# Patient Record
Sex: Female | Born: 1965 | Race: White | Hispanic: No | Marital: Married | Smoking: Former smoker
Health system: Southern US, Community
[De-identification: ages and names within clinical notes are randomized; demographics above are authoritative.]

## PROBLEM LIST (undated history)

## (undated) DIAGNOSIS — Q8501 Neurofibromatosis, type 1: Principal | ICD-10-CM

## (undated) DIAGNOSIS — N63 Unspecified lump in unspecified breast: Secondary | ICD-10-CM

## (undated) DIAGNOSIS — K219 Gastro-esophageal reflux disease without esophagitis: Secondary | ICD-10-CM

## (undated) DIAGNOSIS — F329 Major depressive disorder, single episode, unspecified: Secondary | ICD-10-CM

## (undated) DIAGNOSIS — E039 Hypothyroidism, unspecified: Secondary | ICD-10-CM

## (undated) DIAGNOSIS — R42 Dizziness and giddiness: Secondary | ICD-10-CM

## (undated) DIAGNOSIS — F419 Anxiety disorder, unspecified: Secondary | ICD-10-CM

## (undated) DIAGNOSIS — R519 Headache, unspecified: Secondary | ICD-10-CM

## (undated) DIAGNOSIS — F32A Depression, unspecified: Secondary | ICD-10-CM

## (undated) DIAGNOSIS — G43019 Migraine without aura, intractable, without status migrainosus: Secondary | ICD-10-CM

## (undated) DIAGNOSIS — R1310 Dysphagia, unspecified: Secondary | ICD-10-CM

## (undated) DIAGNOSIS — Q8500 Neurofibromatosis, unspecified: Secondary | ICD-10-CM

## (undated) DIAGNOSIS — R51 Headache: Secondary | ICD-10-CM

## (undated) DIAGNOSIS — M549 Dorsalgia, unspecified: Secondary | ICD-10-CM

## (undated) HISTORY — DX: Depression, unspecified: F32.A

## (undated) HISTORY — DX: Neurofibromatosis, type 1: Q85.01

## (undated) HISTORY — DX: Anxiety disorder, unspecified: F41.9

## (undated) HISTORY — PX: TUBAL LIGATION: SHX77

## (undated) HISTORY — DX: Headache: R51

## (undated) HISTORY — DX: Gastro-esophageal reflux disease without esophagitis: K21.9

## (undated) HISTORY — PX: BLADDER SURGERY: SHX569

## (undated) HISTORY — DX: Dorsalgia, unspecified: M54.9

## (undated) HISTORY — DX: Major depressive disorder, single episode, unspecified: F32.9

## (undated) HISTORY — DX: Unspecified lump in unspecified breast: N63.0

## (undated) HISTORY — DX: Migraine without aura, intractable, without status migrainosus: G43.019

## (undated) HISTORY — DX: Dysphagia, unspecified: R13.10

## (undated) HISTORY — DX: Dizziness and giddiness: R42

## (undated) HISTORY — PX: BREAST LUMPECTOMY: SHX2

## (undated) HISTORY — PX: CHOLECYSTECTOMY: SHX55

## (undated) HISTORY — DX: Headache, unspecified: R51.9

## (undated) HISTORY — DX: Hypothyroidism, unspecified: E03.9

## (undated) HISTORY — DX: Neurofibromatosis, unspecified: Q85.00

---

## 2007-03-21 ENCOUNTER — Ambulatory Visit: Payer: Self-pay | Admitting: Cardiology

## 2007-10-09 ENCOUNTER — Ambulatory Visit: Payer: Self-pay | Admitting: Cardiology

## 2007-11-01 ENCOUNTER — Ambulatory Visit: Payer: Self-pay | Admitting: Cardiology

## 2008-02-25 ENCOUNTER — Ambulatory Visit: Payer: Self-pay | Admitting: Cardiology

## 2011-04-26 NOTE — Assessment & Plan Note (Signed)
[facility]                          [facility] OFFICE NOTE   NAME:[name], [name] PHILLEY                        MRN:          [id number]  DATE:11/01/2007                            DOB:          [birth year]    DATE OF VISIT:  November 01, 2007.   PRIMARY CARDIOLOGIST:  Learta Codding, MD.   REASON FOR VISIT:  Post hospital followup.   HISTORY OF PRESENT ILLNESS:  [name] [name] is a pleasant, 45 year old woman  seen in consultation by Dr. Andee Lineman back on the 28th of October.  She was  admitted at that time with atypical chest pain in the setting of a  family history of cardiovascular disease and hypothyroidism.  She was  referred for a CT scan of her chest, which based on the consultation  note revealed a left apical nodule with recommended followup in three to  six months.  No pulmonary embolus was identified.  She also underwent an  echocardiogram, which was reassuring revealing an ejection fraction of  60 to 65% with trace mitral regurgitation.  Subsequent stress testing  was also performed, and the patient underwent an exercise Cardiolite  demonstrating no diagnostic electrocardiographic changes and no  significant perfusion defects.   She comes into the office today describing recurrent symptoms of  epigastric sharp pain.  There is no particular pattern to this.  She  describes one episode when she was driving in the car from here to  [facility] and had symptoms that lasted approximately 20 minutes and  seemed to be worse when she would press in her epigastric area.  She is  already status post cholecystectomy.  She has had no breathlessness or  clearly reproducible chest pain with exertion.  She does report having  reflux at times and uses an over-the-counter antacid p.r.n.   ALLERGIES:  No known drug allergies.   PRESENT MEDICATIONS:  Levothyroxine 88 mcg daily.   REVIEW OF SYSTEMS:  As described in the history of present illness.  Otherwise  negative.   PHYSICAL EXAMINATION:  VITAL SIGNS:  Blood pressure is 112/72, heart  rate is 66, weight is 127.4 pounds.  GENERAL:  The patient is comfortable and in no acute distress.  HEENT:  Conjunctivae and lids normal, oropharynx clear.  NECK:  Supple, no elevated jugulovenous pressure, no loud bruits, no  thyromegaly is noted.  LUNGS:  Clear without labored breathing.  CARDIAC:  Regular rate and rhythm, no S3 gallop, no loud murmur or  pericardial rub.  ABDOMEN:  Soft, nontender, no guarding is noted, bowel sounds are  present.  EXTREMITIES:  No pitting edema.   A 12-lead electrocardiogram today shows normal sinus rhythm at 69 beats  per minute.   IMPRESSION AND RECOMMENDATIONS:  1. Epigastric discomfort, doubt cardiac etiology based on recent very      reassuring evaluation.  The patient is already status post      cholecystectomy.  It is possible that she is having some esophageal      spasm or other reflux symptoms, perhaps even peptic ulcer disease.      I  plan to give her a trial of a daily proton pump inhibitor and      then we can have her come back to the office to see if she is      having any improvement.  If this is the case, perhaps a referral to      Gastroenterology or her primary care physician (gynecologist) would      be most appropriate.  Doubt that further cardiac testing is needed      at this point.  2. Further plans to follow.     Jonelle Sidle, MD  Electronically Signed    SGM/MedQ  DD: 11/01/2007  DT: 11/01/2007  Job #: [id number]   cc:   Learta Codding, MD,FACC

## 2011-04-26 NOTE — Assessment & Plan Note (Signed)
[facility]                          [facility]ICE NOTE   NAME:[name], [name] SOLAZZO                        MRN:          [id number]  DATE:02/25/2008                            DOB:          1966-12-04    PRIMARY CARDIOLOGIST:  Learta Codding, MD,FACC   REASON FOR VISIT:  Scheduled 27-month follow-up.   The patient returns to the clinic after last seen here in November 2008,  by Dr. Simona Huh.  She is felt to have history of atypical chest pain  and underwent previous extensive workup consisting of a normal, adequate  exercise stress Cardiolite last October, as well as a normal 2-D  echocardiogram.  Moreover, she also had a CT scan of the chest at that  time, which was negative for pulmonary embolus.  There was, however,  suggestion of a left apical nodule with recommendation for a repeat  study in 6 months.  Review of her current hospital records suggests that  this was, in fact, executed just 1 month ago, when she had a return  visit to the emergency room.  Once again, a D-dimer was drawn and was  elevated, leading to a repeat CT scan of the chest which was negative  for pulmonary embolus.  Interestingly, it also suggested that her lungs  were now clear.   The patient was placed on a proton pump inhibitor, by Dr. Diona Browner, when  she was last seen in the clinic.  She reports today that this seemed to  have perhaps some slight benefit; however, she has since run out of this  medication and suggests to me that she has no symptoms suggestive of  active reflux disease.   In summary, [name] [name] suggests symptoms of chest pain which are  unpredictable in onset and are not strictly correlated with activity or  exertion of any kind.   CURRENT MEDICATIONS:  Levothyroxine 0.088 mg q.d.   PHYSICAL EXAMINATION:  VITAL SIGNS:  Blood pressure 102/71, pulse 68,  regular weight 128.8.  GENERAL:  A 45 year old female sitting upright in no distress.  HEENT:   Normocephalic, atraumatic.  CHEST:  Tenderness with palpation.  NECK:  Palpable carotid pulse without bruit; no JVD.  LUNGS:  Clear to auscultation in all fields.  HEART:  Regular rate and rhythm (S1, S2) no significant murmurs.  No  rubs.  ABDOMEN:  Soft, nontender, intact bowel sounds.  EXTREMITIES:  No edema.  NEURO:  Flat affect, but no focal deficits.   IMPRESSION:  1. Atypical chest pain.      a.     Suspect costochondritis.      b.     Normal adequate exercise stress Cardiolite; EF 65%, October       2008.      c.     Normal 2-D echocardiogram.  2. Chronically elevated D-dimer.      a.     Negative CT angiogram of chest for pulmonary embolus.  3. Hypothyroidism.  4. History of normocytic anemia.  5. History of elevated liver enzymes.   PLAN:  1. The patient presents with  symptoms which are not suggestive of      ischemic heart disease, and most likely are musculoskeletal in      etiology.  I therefore reassured her that no further cardiac workup      is recommended.  I advised using p.r.n. ibuprofen for probable      costochondritis.  I also reviewed the results of a recent, repeat      chest CT scan, which once again showed no evidence of pulmonary      embolus, and also suggested no residual pulmonary disease.  Her      previous study last October suggested a possible infiltrative      density in the left apex; her recent study, however, indicated that      her lungs were clear.  Of note, there was also no evidence of      aortic dissection.  2. No further cardiac workup is indicated.  The patient is advised to      resume regular scheduled follow-up with Dr. Linna Darner, and we will have      her return to Dr. Lewayne Bunting on an as-needed basis.      Gene Serpe, PA-C  Electronically Signed      Learta Codding, MD,FACC  Electronically Signed   GS/MedQ  DD: 02/25/2008  DT: 02/25/2008  Job #: [id number]   cc:   Erasmo Downer, MD

## 2015-08-24 ENCOUNTER — Ambulatory Visit (INDEPENDENT_AMBULATORY_CARE_PROVIDER_SITE_OTHER): Payer: Medicaid Other | Admitting: Neurology

## 2015-08-24 ENCOUNTER — Encounter: Payer: Self-pay | Admitting: Neurology

## 2015-08-24 VITALS — BP 115/67 | HR 75 | Ht 66.0 in | Wt 135.5 lb

## 2015-08-24 DIAGNOSIS — Q8501 Neurofibromatosis, type 1: Secondary | ICD-10-CM

## 2015-08-24 DIAGNOSIS — G43019 Migraine without aura, intractable, without status migrainosus: Secondary | ICD-10-CM | POA: Diagnosis not present

## 2015-08-24 HISTORY — DX: Neurofibromatosis, type 1: Q85.01

## 2015-08-24 HISTORY — DX: Migraine without aura, intractable, without status migrainosus: G43.019

## 2015-08-24 MED ORDER — TOPIRAMATE 25 MG PO TABS: ORAL_TABLET | ORAL | Status: DC

## 2015-08-24 NOTE — Patient Instructions (Addendum)
We will restart Topamax for the headache. We will work up to 75 mg at night, call if the headaches continue.  Topamax (topiramate) is a seizure medication that has an FDA approval for seizures and for migraine headache. Potential side effects of this medication include weight loss, cognitive slowing, tingling in the fingers and toes, and carbonated drinks will taste bad. If any significant side effects are noted on this drug, please contact our office.  Migraine Headache A migraine headache is an intense, throbbing pain on one or both sides of your head. A migraine can last for 30 minutes to several hours. CAUSES  The exact cause of a migraine headache is not always known. However, a migraine may be caused when nerves in the brain become irritated and release chemicals that cause inflammation. This causes pain. Certain things may also trigger migraines, such as:  Alcohol.  Smoking.  Stress.  Menstruation.  Aged cheeses.  Foods or drinks that contain nitrates, glutamate, aspartame, or tyramine.  Lack of sleep.  Chocolate.  Caffeine.  Hunger.  Physical exertion.  Fatigue.  Medicines used to treat chest pain (nitroglycerine), birth control pills, estrogen, and some blood pressure medicines. SIGNS AND SYMPTOMS  Pain on one or both sides of your head.  Pulsating or throbbing pain.  Severe pain that prevents daily activities.  Pain that is aggravated by any physical activity.  Nausea, vomiting, or both.  Dizziness.  Pain with exposure to bright lights, loud noises, or activity.  General sensitivity to bright lights, loud noises, or smells. Before you get a migraine, you may get warning signs that a migraine is coming (aura). An aura may include:  Seeing flashing lights.  Seeing bright spots, halos, or zigzag lines.  Having tunnel vision or blurred vision.  Having feelings of numbness or tingling.  Having trouble talking.  Having muscle  weakness. DIAGNOSIS  A migraine headache is often diagnosed based on:  Symptoms.  Physical exam.  A CT scan or MRI of your head. These imaging tests cannot diagnose migraines, but they can help rule out other causes of headaches. TREATMENT Medicines may be given for pain and nausea. Medicines can also be given to help prevent recurrent migraines.  HOME CARE INSTRUCTIONS  Only take over-the-counter or prescription medicines for pain or discomfort as directed by your health care provider. The use of long-term narcotics is not recommended.  Lie down in a dark, quiet room when you have a migraine.  Keep a journal to find out what may trigger your migraine headaches. For example, write down:  What you eat and drink.  How much sleep you get.  Any change to your diet or medicines.  Limit alcohol consumption.  Quit smoking if you smoke.  Get 7-9 hours of sleep, or as recommended by your health care provider.  Limit stress.  Keep lights dim if bright lights bother you and make your migraines worse. SEEK IMMEDIATE MEDICAL CARE IF:   Your migraine becomes severe.  You have a fever.  You have a stiff neck.  You have vision loss.  You have muscular weakness or loss of muscle control.  You start losing your balance or have trouble walking.  You feel faint or pass out.  You have severe symptoms that are different from your first symptoms. MAKE SURE YOU:   Understand these instructions.  Will watch your condition.  Will get help right away if you are not doing well or get worse. Document Released: 11/28/2005 Document Revised: 04/14/2014  Document Reviewed: 08/05/2013 [facility] Patient Information 2015 [place], [place]. This information is not intended to replace advice given to you by your health care provider. Make sure you discuss any questions you have with your health care provider.

## 2015-08-24 NOTE — Progress Notes (Signed)
Reason for visit: Neurofibromatosis  Referring physician: Dr. Enid Baas is a 49 y.o. female  History of present illness:  Ms. Piekarski is a 49 year old right-handed white female with a history of skin lesions consistent with neurofibromatosis. The patient herself does not know anything about this, she denies any family history of other individuals with similar skin lesions. She is unaware of anyone in the family with the diagnosis of neurofibromatosis. She is not sure how long she has had the neurofibroma. She indicates that none of the neurofibromas are large, and none of them bother her. She mainly has the spots on her abdomen and arms. The patient does have a history of migraine headaches with recurrent headaches over the last 5 years or more. The headaches are throughout the head. The patient has had virtually daily headaches for greater than 5 years. She has occasionally required injections in the hospital, she indicates that a head scan of some sort was done within the last year at [facility]. She was told that the study was unremarkable. She has frequently 2 headaches a day, lasting up to 3 hours. She may have nausea and vomiting, blurring of vision, but no numbness or weakness of the face, arms, or legs. She denies any particular activators for her headache, and she does have photophobia and phonophobia with headache. She may have to lie down with the headache on occasion. She denies any balance issues, she does report frequent bladder infections. She is sent to this office for an evaluation. She was placed on Topamax previously for headache, but never on a dose greater than 25 mg.  Past Medical History  Diagnosis Date  . Breast mass   . Dysphagia   . GERD (gastroesophageal reflux disease)   . Headache   . Anxiety   . Hypothyroid   . Back pain   . Depression   . Neurofibromatosis   . Vertigo   . 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 Common migraine with  intractable migraine 08/24/2015    Past Surgical History  Procedure Laterality Date  . Cholecystectomy    . Bladder surgery    . Tubal ligation      Family History  Problem Relation Age of Onset  . Breast cancer Mother   . Hyperlipidemia Father   . Hypertension Father   . CAD Father   . Breast cancer Sister   . Breast cancer Paternal Aunt   . CAD Brother   . Neurofibromatosis Neg Hx     Social history:  reports that she quit smoking about 26 years ago. She has never used smokeless tobacco. She reports that she does not drink alcohol or use illicit drugs.  Medications:  Prior to Admission medications   Medication Sig Start Date End Date Taking? Authorizing Provider  cyclobenzaprine (FLEXERIL) 10 MG tablet Take 10 mg by mouth daily.   Yes Historical Provider, MD  escitalopram (LEXAPRO) 20 MG tablet Take 20 mg by mouth daily.   Yes Historical Provider, MD  levothyroxine (SYNTHROID, LEVOTHROID) 75 MCG tablet Take 75 mcg by mouth daily before breakfast.   Yes Historical Provider, MD  naproxen (NAPROSYN) 500 MG tablet Take 500 mg by mouth 2 (two) times daily with a meal.   Yes Historical Provider, MD  solifenacin (VESICARE) 5 MG tablet Take 5 mg by mouth daily.   Yes Historical Provider, MD     No Known Allergies  ROS:  Out of a complete 14 system review of symptoms,  the patient complains only of the following symptoms, and all other reviewed systems are negative.  Back pain, neck pain Headache Chills  Blood pressure 115/67, pulse 75, height  (1.676 m), weight 135 lb 8 oz (61.462 kg).  Physical Exam  General: The patient is alert and cooperative at the time of the examination.  Eyes: Pupils are equal, round, and reactive to light. Discs are flat bilaterally.  Neck: The neck is supple, no carotid bruits are noted.  Respiratory: The respiratory examination is clear.  Cardiovascular: The cardiovascular examination reveals a regular rate and rhythm, no obvious  murmurs or rubs are noted.  Skin: Extremities are without significant edema. Multiple neurofibroma are present over the forearms and abdomen.  Neurologic Exam  Mental status: The patient is alert and oriented x 3 at the time of the examination. The patient has apparent normal recent and remote memory, with an apparently normal attention span and concentration ability.  Cranial nerves: Facial symmetry is present. There is good sensation of the face to pinprick and soft touch bilaterally. The strength of the facial muscles and the muscles to head turning and shoulder shrug are normal bilaterally. Speech is well enunciated, no aphasia or dysarthria is noted. Extraocular movements are full. Visual fields are full. The tongue is midline, and the patient has symmetric elevation of the soft palate. No obvious hearing deficits are noted.  Motor: The motor testing reveals 5 over 5 strength of all 4 extremities. Good symmetric motor tone is noted throughout.  Sensory: Sensory testing is intact to pinprick, soft touch, vibration sensation, and position sense on all 4 extremities. No evidence of extinction is noted.  Coordination: Cerebellar testing reveals good finger-nose-finger and heel-to-shin bilaterally.  Gait and station: Gait is normal. Tandem gait is normal. Romberg is negative. No drift is seen.  Reflexes: Deep tendon reflexes are symmetric and normal bilaterally. Toes are downgoing bilaterally.   Assessment/Plan:  1. Neurofibromatosis type I  2. Common migraine, intractable  The patient appears to have neurofibroma, consistent with neurofibromatosis type I. The neurofibroma are small, and do not appear to be resulting in any functional issues with her. The patient denies any family history of neurofibromatosis. Migraine headaches are a significant issue for her, she was placed on low-dose Topamax, but the medication was never given a fair trial. She will go back on the Topamax, working up  to 75 mg at night, if she is tolerating medication but the headaches continue, she is contact our office and the dose will be increased. She will follow-up in 3 months. We will try to get the report of the head scan done at [facility].  C. Lesia Sago MD 08/24/2015 7:22 PM  [facility] [address] [place], [place] [address]  Phone [phone] Fax [phone]

## 2015-11-23 ENCOUNTER — Ambulatory Visit (INDEPENDENT_AMBULATORY_CARE_PROVIDER_SITE_OTHER): Payer: Medicaid Other | Admitting: Adult Health

## 2015-11-23 ENCOUNTER — Encounter: Payer: Self-pay | Admitting: Adult Health

## 2015-11-23 VITALS — BP 116/72 | HR 93 | Ht 66.0 in | Wt 134.0 lb

## 2015-11-23 DIAGNOSIS — G43019 Migraine without aura, intractable, without status migrainosus: Secondary | ICD-10-CM | POA: Diagnosis not present

## 2015-11-23 DIAGNOSIS — Q8501 Neurofibromatosis, type 1: Secondary | ICD-10-CM

## 2015-11-23 MED ORDER — TOPIRAMATE 25 MG PO TABS: 50.0000 mg | ORAL_TABLET | Freq: Two times a day (BID) | ORAL | Status: DC

## 2015-11-23 NOTE — Progress Notes (Signed)
PATIENT: [name] [name] DOB: 1965/12/19  REASON FOR VISIT: follow up- neurofibromatosis type I, migraine headaches HISTORY FROM: patient  HISTORY OF PRESENT ILLNESS: Miss [name] [name] is a 49 year old female with a history of neurofibromatosis type I and migraine headaches. She returns today for follow-up. At the last visit she was started on Topamax 75 mg daily. She reports that there is been no change in her headache frequency. She states that she's been tolerating the Topamax well. She continues to wake up and go to bed  with a headache. She rates her pain as a 10 out of 10. Her headaches location is usually in the frontal and normally will squeeze like a band around the head down to the shoulders. She does have photophobia and phonophobia. She has nausea but denies vomiting. She does not take any over-the-counter medication for her headaches. She returns today for an evaluation.  HISTORY 08/24/2015 [name] [name](Willis): [name] is a 49 year old right-handed white female with a history of skin lesions consistent with neurofibromatosis. The patient herself does not know anything about this, she denies any family history of other individuals with similar skin lesions. She is unaware of anyone in the family with the diagnosis of neurofibromatosis. She is not sure how long she has had the neurofibroma. She indicates that none of the neurofibromas are large, and none of them bother her. She mainly has the spots on her abdomen and arms. The patient does have a history of migraine headaches with recurrent headaches over the last 5 years or more. The headaches are throughout the head. The patient has had virtually daily headaches for greater than 5 years. She has occasionally required injections in the hospital, she indicates that a head scan of some sort was done within the last year at [facility]. She was told that the study was unremarkable. She has frequently 2 headaches a day, lasting up to 3 hours. She may have  nausea and vomiting, blurring of vision, but no numbness or weakness of the face, arms, or legs. She denies any particular activators for her headache, and she does have photophobia and phonophobia with headache. She may have to lie down with the headache on occasion. She denies any balance issues, she does report frequent bladder infections. She is sent to this office for an evaluation. She was placed on Topamax previously for headache, but never on a dose greater than 25 mg.  REVIEW OF SYSTEMS: Out of a complete 14 system review of symptoms, the patient complains only of the following symptoms, and all other reviewed systems are negative.  See history of present illness  ALLERGIES: No Known Allergies  HOME MEDICATIONS: Outpatient Prescriptions Prior to Visit  Medication Sig Dispense Refill  . escitalopram (LEXAPRO) 20 MG tablet Take 20 mg by mouth daily.    [name] [name]. levothyroxine (SYNTHROID, LEVOTHROID) 75 MCG tablet Take 75 mcg by mouth daily before breakfast.    . solifenacin (VESICARE) 5 MG tablet Take 5 mg by mouth daily.    [name] [name]. topiramate (TOPAMAX) 25 MG tablet Take one tablet at night for one week, then take 2 tablets at night for one week, then take 3 tablets at night. 90 tablet 3  . cyclobenzaprine (FLEXERIL) 10 MG tablet Take 10 mg by mouth daily.    . naproxen (NAPROSYN) 500 MG tablet Take 500 mg by mouth 2 (two) times daily with a meal.     No facility-administered medications prior to visit.    PAST MEDICAL HISTORY: Past Medical History  Diagnosis Date  . Breast mass   . Dysphagia   . GERD (gastroesophageal reflux disease)   . Headache   . Anxiety   . Hypothyroid   . Back pain   . Depression   . Neurofibromatosis (HCC)   . Vertigo   . 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 Common migraine with intractable migraine 08/24/2015    PAST SURGICAL HISTORY: Past Surgical History  Procedure Laterality Date  . Cholecystectomy    . Bladder surgery    . Tubal ligation       FAMILY HISTORY: Family History  Problem Relation Age of Onset  . Breast cancer Mother   . Hyperlipidemia Father   . Hypertension Father   . CAD Father   . Breast cancer Sister   . Breast cancer Paternal Aunt   . CAD Brother   . Neurofibromatosis Neg Hx     SOCIAL HISTORY: Social History   Social History  . Marital Status: Married    Spouse Name: N/A  . Number of Children: 2  . Years of Education: 12   Occupational History  . unemployed    Social History Main Topics  . Smoking status: Former Smoker    Quit date: 12/12/1988  . Smokeless tobacco: Never Used  . Alcohol Use: No  . Drug Use: No  . Sexual Activity: Not on file   Other Topics Concern  . Not on file   Social History Narrative   Patient drinks 2 cups of caffeine daily.   Patient is right handed.      PHYSICAL EXAM  Filed Vitals:   11/23/15 1345  BP: 116/72  Pulse: 93  Height:  (1.676 m)  Weight: 134 lb (60.782 kg)   Body mass index is 21.64 kg/(m^2).  Generalized: Well developed, in no acute distress   Neurological examination  Mentation: Alert oriented to time, place, history taking. Follows all commands speech and language fluent Cranial nerve II-XII: Pupils were equal round reactive to light. Extraocular movements were full, visual field were full on confrontational test. Facial sensation and strength were normal. Uvula tongue midline. Head turning and shoulder shrug  were normal and symmetric. Motor: The motor testing reveals 5 over 5 strength of all 4 extremities. Good symmetric motor tone is noted throughout.  Sensory: Sensory testing is intact to soft touch on all 4 extremities. No evidence of extinction is noted.  Coordination: Cerebellar testing reveals good finger-nose-finger and heel-to-shin bilaterally.  Gait and station: Gait is normal. Tandem gait is normal. Romberg is negative. No drift is seen.  Reflexes: Deep tendon reflexes are symmetric and normal bilaterally.    DIAGNOSTIC DATA (LABS, IMAGING, TESTING) - I reviewed patient records, labs, notes, testing and imaging myself where available.     ASSESSMENT AND PLAN 49 y.o. year old female  has a past medical history of Breast mass; Dysphagia; GERD (gastroesophageal reflux disease); Headache; Anxiety; Hypothyroid; Back pain; Depression; Neurofibromatosis (HCC); Vertigo; Neurofibromatosis, type 1 (HCC) (08/24/2015); and Common migraine with intractable migraine (08/24/2015). here with:  1. Neurofibromatosis type I 2. Migraine headaches   The patient's headache frequency has not improved on Topamax. I will increase Topamax to 50 mg twice a day. Patient advised to call our office if this is not beneficial for her headaches. She verbalized understanding. The description that the patient gives for her headaches sound similar to tension-type headaches. In the future medication such as tizanidine may be beneficial. She will follow-up in 2 months or sooner if needed.  Butch Penny, MSN, NP-C 11/23/2015, 2:03 PM [facility] [address], [address] [place], [place] [address] [phone]

## 2015-11-23 NOTE — Patient Instructions (Signed)
Increase Topamax to 2 tablets in the morning and 2 tablets at bedtime.  If there is no improvement in your headaches CALL so we can increase the dose If your symptoms worsen or you develop new symptoms please let us know.

## 2015-11-23 NOTE — Progress Notes (Signed)
I have read the note, and I agree with the clinical assessment and plan.  Pastor Sgro KEITH   

## 2016-01-05 ENCOUNTER — Telehealth: Payer: Self-pay | Admitting: Neurology

## 2016-01-05 MED ORDER — TOPIRAMATE 25 MG PO TABS: 50.0000 mg | ORAL_TABLET | Freq: Two times a day (BID) | ORAL | Status: DC

## 2016-01-05 NOTE — Telephone Encounter (Signed)
Samantha[facility] [phone] called to advise they never received prescription request for topiramate (TOPAMAX) 25 MG tablet on 11/23/15.

## 2016-01-05 NOTE — Telephone Encounter (Signed)
Rx has been resent 

## 2016-01-25 ENCOUNTER — Ambulatory Visit (INDEPENDENT_AMBULATORY_CARE_PROVIDER_SITE_OTHER): Payer: Medicaid Other | Admitting: Adult Health

## 2016-01-25 ENCOUNTER — Encounter: Payer: Self-pay | Admitting: Adult Health

## 2016-01-25 VITALS — BP 107/67 | HR 90 | Ht 66.0 in | Wt 135.0 lb

## 2016-01-25 DIAGNOSIS — Q8501 Neurofibromatosis, type 1: Secondary | ICD-10-CM | POA: Diagnosis not present

## 2016-01-25 DIAGNOSIS — G44209 Tension-type headache, unspecified, not intractable: Secondary | ICD-10-CM | POA: Diagnosis not present

## 2016-01-25 MED ORDER — TIZANIDINE HCL 2 MG PO TABS: 2.0000 mg | ORAL_TABLET | Freq: Every day | ORAL | Status: DC

## 2016-01-25 NOTE — Patient Instructions (Signed)
Try Tizanidine 2 mg at bedtime Continue Topamax Topamax and tizanidine can cause drowsiness If your symptoms worsen or you develop new symptoms please let us know.

## 2016-01-25 NOTE — Progress Notes (Signed)
PATIENT: [name] [name] DOB: [birth year]  REASON FOR VISIT: follow up- neurofibromatosis type I, migraine headaches HISTORY FROM: patient  HISTORY OF PRESENT ILLNESS: [name] [name] is a 50 year old female with a history of neurofibromatosis type I and migraine headaches. She returns today for follow-up. She continues to take Topamax 50 mg twice a day. She reports that her headaches have remained the same. She has a daily headache. She states that occasionally her headache pain will be a 10 out of 10 on the pain scale. Today her head feels like there is a squeezing sensation around her entire head. She also has discomfort that radiates down into the shoulders bilaterally. She states occasionally she'll have some nausea but no vomiting. Denies photophobia or phonophobia. The patient has only tried Topamax for her headaches. She also states that she has some lower back pain that extends across the lower back but does not radiate down the legs. She states this pain is exacerbated with sitting for longer periods or laying on her sides for longer periods at night. She returns today for an evaluation.  HISTORY 11/23/15: [name] [name] is a 50 year old female with a history of neurofibromatosis type I and migraine headaches. She returns today for follow-up. At the last visit she was started on Topamax 75 mg daily. She reports that there is been no change in her headache frequency. She states that she's been tolerating the Topamax well. She continues to wake up and go to bed with a headache. She rates her pain as a 10 out of 10. Her headaches location is usually in the frontal and normally will squeeze like a band around the head down to the shoulders. She does have photophobia and phonophobia. She has nausea but denies vomiting. She does not take any over-the-counter medication for her headaches. She returns today for an evaluation.  HISTORY 08/24/2015 [name] [name]): [name] [name] is a 50 year old right-handed white female  with a history of skin lesions consistent with neurofibromatosis. The patient herself does not know anything about this, she denies any family history of other individuals with similar skin lesions. She is unaware of anyone in the family with the diagnosis of neurofibromatosis. She is not sure how long she has had the neurofibroma. She indicates that none of the neurofibromas are large, and none of them bother her. She mainly has the spots on her abdomen and arms. The patient does have a history of migraine headaches with recurrent headaches over the last 5 years or more. The headaches are throughout the head. The patient has had virtually daily headaches for greater than 5 years. She has occasionally required injections in the hospital, she indicates that a head scan of some sort was done within the last year at [facility]. She was told that the study was unremarkable. She has frequently 2 headaches a day, lasting up to 3 hours. She may have nausea and vomiting, blurring of vision, but no numbness or weakness of the face, arms, or legs. She denies any particular activators for her headache, and she does have photophobia and phonophobia with headache. She may have to lie down with the headache on occasion. She denies any balance issues, she does report frequent bladder infections. She is sent to this office for an evaluation. She was placed on Topamax previously for headache, but never on a dose greater than 25 mg  REVIEW OF SYSTEMS: Out of a complete 14 system review of symptoms, the patient complains only of the following symptoms, and all  other reviewed systems are negative.  Headache, back pain, neck pain, ringing in ears  ALLERGIES: No Known Allergies  HOME MEDICATIONS: Outpatient Prescriptions Prior to Visit  Medication Sig Dispense Refill  . escitalopram (LEXAPRO) 20 MG tablet Take 20 mg by mouth daily.    Marland Kitchen levothyroxine (SYNTHROID, LEVOTHROID) 75 MCG tablet Take 75 mcg by mouth daily  before breakfast.    . omeprazole (PRILOSEC) 20 MG capsule Take 20 mg by mouth daily.    . solifenacin (VESICARE) 5 MG tablet Take 5 mg by mouth daily.    Marland Kitchen topiramate (TOPAMAX) 25 MG tablet Take 2 tablets (50 mg total) by mouth 2 (two) times daily. 120 tablet 3   No facility-administered medications prior to visit.    PAST MEDICAL HISTORY: Past Medical History  Diagnosis Date  . Breast mass   . Dysphagia   . GERD (gastroesophageal reflux disease)   . Headache   . Anxiety   . Hypothyroid   . Back pain   . Depression   . Neurofibromatosis (HCC)   . Vertigo   . 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 Common migraine with intractable migraine 08/24/2015    PAST SURGICAL HISTORY: Past Surgical History  Procedure Laterality Date  . Cholecystectomy    . Bladder surgery    . Tubal ligation      FAMILY HISTORY: Family History  Problem Relation Age of Onset  . Breast cancer Mother   . Hyperlipidemia Father   . Hypertension Father   . CAD Father   . Breast cancer Sister   . Breast cancer Paternal Aunt   . CAD Brother   . Neurofibromatosis Neg Hx     SOCIAL HISTORY: Social History   Social History  . Marital Status: Married    Spouse Name: N/A  . Number of Children: 2  . Years of Education: 12   Occupational History  . unemployed    Social History Main Topics  . Smoking status: Former Smoker    Quit date: 12/12/1988  . Smokeless tobacco: Never Used  . Alcohol Use: No  . Drug Use: No  . Sexual Activity: Not on file   Other Topics Concern  . Not on file   Social History Narrative   Patient drinks 2 cups of caffeine daily.   Patient is right handed.      PHYSICAL EXAM  Filed Vitals:   01/25/16 1408  BP: 107/67  Pulse: 90  Height: 5\' 6"  (1.676 m)  Weight: 135 lb (61.236 kg)   Body mass index is 21.8 kg/(m^2).  Generalized: Well developed, in no acute distress   Neurological examination  Mentation: Alert oriented to time, place, history  taking. Follows all commands speech and language fluent Cranial nerve II-XII: Pupils were equal round reactive to light. Extraocular movements were full, visual field were full on confrontational test. Facial sensation and strength were normal. Uvula tongue midline. Head turning and shoulder shrug  were normal and symmetric. Motor: The motor testing reveals 5 over 5 strength of all 4 extremities. Good symmetric motor tone is noted throughout.  Sensory: Sensory testing is intact to soft touch on all 4 extremities. No evidence of extinction is noted.  Coordination: Cerebellar testing reveals good finger-nose-finger and heel-to-shin bilaterally.  Gait and station: Gait is normal. Tandem gait is normal. Romberg is negative. No drift is seen.  Reflexes: Deep tendon reflexes are symmetric and normal bilaterally.   DIAGNOSTIC DATA (LABS, IMAGING, TESTING) - I reviewed patient records, labs, notes,  testing and imaging myself where available.    ASSESSMENT AND PLAN 50 y.o. year old female  has a past medical history of Breast mass; Dysphagia; GERD (gastroesophageal reflux disease); Headache; Anxiety; Hypothyroid; Back pain; Depression; Neurofibromatosis (HCC); Vertigo; Neurofibromatosis, type 1 (HCC) (08/24/2015); and Common migraine with intractable migraine (08/24/2015). here with:  1. Tension headaches 2. Neurofibromatosis type I  The patient has not gotten any relief with her migraines while on Topamax. Today her description of her headaches are consistent with tension-type headaches. She will remain on Topamax but I will initiate tizanidine as well. She will begin taking 2 mg at bedtime. Patient advised that tizanidine in addition to Topamax can cause drowsiness. If her symptoms do not improve she should let us know. She will follow-up in 3-4 months or sooner if needed.     Butch Penny, MSN, NP-C 01/25/2016, 3:09 PM [facility] [address], [address] [place], [place]  [address] [phone]

## 2016-01-25 NOTE — Progress Notes (Signed)
I have read the note, and I agree with the clinical assessment and plan.  [name] [name]   

## 2016-04-25 ENCOUNTER — Encounter: Payer: Self-pay | Admitting: Adult Health

## 2016-04-25 ENCOUNTER — Ambulatory Visit (INDEPENDENT_AMBULATORY_CARE_PROVIDER_SITE_OTHER): Payer: Medicaid Other | Admitting: Adult Health

## 2016-04-25 VITALS — BP 113/74 | HR 76 | Ht 66.0 in | Wt 133.2 lb

## 2016-04-25 DIAGNOSIS — G44221 Chronic tension-type headache, intractable: Secondary | ICD-10-CM

## 2016-04-25 DIAGNOSIS — Q8501 Neurofibromatosis, type 1: Secondary | ICD-10-CM | POA: Diagnosis not present

## 2016-04-25 MED ORDER — TIZANIDINE HCL 2 MG PO TABS: 4.0000 mg | ORAL_TABLET | Freq: Every day | ORAL | Status: DC

## 2016-04-25 NOTE — Progress Notes (Signed)
I have read the note, and I agree with the clinical assessment and plan.  Ioannis Schuh KEITH   

## 2016-04-25 NOTE — Progress Notes (Signed)
PATIENT: [name] [name] DOB: 05/11/1966  REASON FOR VISIT: follow up- neurofibromatosis type I, migraine headaches HISTORY FROM: patient  HISTORY OF PRESENT ILLNESS: [name] is a 50 year old female with a history of neurofibromatosis type I and migraine headaches. She returns today for follow-up. She continues to take Topamax 50 mg twice a day. At the last visit she was started on tizanidine 2 mg at bedtime. She states that she has not noticed much improvement in her headaches since starting tizanidine. She continues to have frequent headaches. Today she has a headache that she describes as a pressure sensation around the head radiating to the neck. She reports some mild photophobia and phonophobia but denies nausea and vomiting. She states that when she does take Tylenol she gets some relief. She returns today for an evaluation.  HISTORY 01/25/16 (MM):Ms [name] [name] is a 50 year old female with a history of neurofibromatosis type I and migraine headaches. She returns today for follow-up. She continues to take Topamax 50 mg twice a day. She reports that her headaches have remained the same. She has a daily headache. She states that occasionally her headache pain will be a 10 out of 10 on the pain scale. Today her head feels like there is a squeezing sensation around her entire head. She also has discomfort that radiates down into the shoulders bilaterally. She states occasionally she'll have some nausea but no vomiting. Denies photophobia or phonophobia. The patient has only tried Topamax for her headaches. She also states that she has some lower back pain that extends across the lower back but does not radiate down the legs. She states this pain is exacerbated with sitting for longer periods or laying on her sides for longer periods at night. She returns today for an evaluation.  HISTORY 11/23/15: [name] [name] is a 50 year old female with a history of neurofibromatosis type I and migraine headaches. She  returns today for follow-up. At the last visit she was started on Topamax 75 mg daily. She reports that there is been no change in her headache frequency. She states that she's been tolerating the Topamax well. She continues to wake up and go to bed with a headache. She rates her pain as a 10 out of 10. Her headaches location is usually in the frontal and normally will squeeze like a band around the head down to the shoulders. She does have photophobia and phonophobia. She has nausea but denies vomiting. She does not take any over-the-counter medication for her headaches. She returns today for an evaluation.  HISTORY 08/24/2015 [name] [name]([name] [name]): [name] is a 50 year old right-handed white female with a history of skin lesions consistent with neurofibromatosis. The patient herself does not know anything about this, she denies any family history of other individuals with similar skin lesions. She is unaware of anyone in the family with the diagnosis of neurofibromatosis. She is not sure how long she has had the neurofibroma. She indicates that none of the neurofibromas are large, and none of them bother her. She mainly has the spots on her abdomen and arms. The patient does have a history of migraine headaches with recurrent headaches over the last 5 years or more. The headaches are throughout the head. The patient has had virtually daily headaches for greater than 5 years. She has occasionally required injections in the hospital, she indicates that a head scan of some sort was done within the last year at [facility]. She was told that the study was unremarkable. She has frequently  2 headaches a day, lasting up to 3 hours. She may have nausea and vomiting, blurring of vision, but no numbness or weakness of the face, arms, or legs. She denies any particular activators for her headache, and she does have photophobia and phonophobia with headache. She may have to lie down with the headache on occasion. She denies any  balance issues, she does report frequent bladder infections. She is sent to this office for an evaluation. She was placed on Topamax previously for headache, but never on a dose greater than 25 mg   REVIEW OF SYSTEMS: Out of a complete 14 system review of symptoms, the patient complains only of the following symptoms, and all other reviewed systems are negative.  Chills, excessive sweating, trouble swallowing, choking, cough, eye itching, excessive thirst, nausea, sleep talking, dizziness, headache, swollen lymph nodes, bruise/bleed easily  ALLERGIES: No Known Allergies  HOME MEDICATIONS: Outpatient Prescriptions Prior to Visit  Medication Sig Dispense Refill  . escitalopram (LEXAPRO) 20 MG tablet Take 20 mg by mouth daily.    Marland Kitchen omeprazole (PRILOSEC) 20 MG capsule Take 20 mg by mouth daily.    . solifenacin (VESICARE) 5 MG tablet Take 5 mg by mouth daily.    Marland Kitchen tiZANidine (ZANAFLEX) 2 MG tablet Take 1 tablet (2 mg total) by mouth at bedtime. 30 tablet 3  . topiramate (TOPAMAX) 25 MG tablet Take 2 tablets (50 mg total) by mouth 2 (two) times daily. 120 tablet 3  . levothyroxine (SYNTHROID, LEVOTHROID) 75 MCG tablet Take 75 mcg by mouth daily before breakfast.     No facility-administered medications prior to visit.    PAST MEDICAL HISTORY: Past Medical History  Diagnosis Date  . Breast mass   . Dysphagia   . GERD (gastroesophageal reflux disease)   . Headache   . Anxiety   . Hypothyroid   . Back pain   . Depression   . Neurofibromatosis (HCC)   . Vertigo   . 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 Common migraine with intractable migraine 08/24/2015    PAST SURGICAL HISTORY: Past Surgical History  Procedure Laterality Date  . Cholecystectomy    . Bladder surgery    . Tubal ligation      FAMILY HISTORY: Family History  Problem Relation Age of Onset  . Breast cancer Mother   . Hyperlipidemia Father   . Hypertension Father   . CAD Father   . Breast cancer Sister     . Breast cancer Paternal Aunt   . CAD Brother   . Neurofibromatosis Neg Hx     SOCIAL HISTORY: Social History   Social History  . Marital Status: Married    Spouse Name: N/A  . Number of Children: 2  . Years of Education: 12   Occupational History  . unemployed    Social History Main Topics  . Smoking status: Former Smoker    Quit date: 12/12/1988  . Smokeless tobacco: Never Used  . Alcohol Use: No  . Drug Use: No  . Sexual Activity: Not on file   Other Topics Concern  . Not on file   Social History Narrative   Patient drinks 2 cups of caffeine daily.   Patient is right handed.      PHYSICAL EXAM  Filed Vitals:   04/25/16 1347  BP: 113/74  Pulse: 76  Height:  (1.676 m)  Weight: 133 lb 3.2 oz (60.419 kg)   Body mass index is 21.51 kg/(m^2).  Generalized: Well developed, in  no acute distress   Neurological examination  Mentation: Alert oriented to time, place, history taking. Follows all commands speech and language fluent Cranial nerve II-XII: Pupils were equal round reactive to light. Extraocular movements were full, visual field were full on confrontational test. Facial sensation and strength were normal. Uvula tongue midline. Head turning and shoulder shrug  were normal and symmetric. Motor: The motor testing reveals 5 over 5 strength of all 4 extremities. Good symmetric motor tone is noted throughout.  Sensory: Sensory testing is intact to soft touch on all 4 extremities. No evidence of extinction is noted.  Coordination: Cerebellar testing reveals good finger-nose-finger and heel-to-shin bilaterally.  Gait and station: Gait is normal. Tandem gait is normal. Romberg is negative. No drift is seen.  Reflexes: Deep tendon reflexes are symmetric and normal bilaterally.   DIAGNOSTIC DATA (LABS, IMAGING, TESTING) - I reviewed patient records, labs, notes, testing and imaging myself where available.     ASSESSMENT AND PLAN 50 y.o. year old female   has a past medical history of Breast mass; Dysphagia; GERD (gastroesophageal reflux disease); Headache; Anxiety; Hypothyroid; Back pain; Depression; Neurofibromatosis (HCC); Vertigo; Neurofibromatosis, type 1 (HCC) (08/24/2015); and Common migraine with intractable migraine (08/24/2015). here with:  1. Headache 2. Neurofibromatosis type I  The patient will remain on Topamax 50 mg twice a day. I will increase tizanidine to 4 mg at bedtime. Patient advised that if this is not beneficial for her headaches she should let us know. In the future we can consider increasing Topamax. The patient is currently on Lexapro for depression. Advised the patient that there are tricyclic antidepressants that may help with her headaches however she would have to consider weaning off of Lexapro. At this time the patient does not want to stop this medication. She returns today for an evaluation.     Butch Penny, MSN, NP-C 04/25/2016, 2:10 PM [facility] [address], [address] [place], [place] [address] [phone]

## 2016-04-25 NOTE — Patient Instructions (Signed)
Continue Topamax 50 mg twice a day Increase Tizanidine to 4 mg ( 2 tablets) at bedtime If your symptoms worsen or you develop new symptoms please let us know.

## 2016-08-29 ENCOUNTER — Ambulatory Visit (INDEPENDENT_AMBULATORY_CARE_PROVIDER_SITE_OTHER): Payer: Medicaid Other | Admitting: Adult Health

## 2016-08-29 ENCOUNTER — Encounter: Payer: Self-pay | Admitting: Adult Health

## 2016-08-29 VITALS — BP 96/62 | HR 77 | Ht 66.0 in | Wt 134.4 lb

## 2016-08-29 DIAGNOSIS — Q8501 Neurofibromatosis, type 1: Secondary | ICD-10-CM | POA: Diagnosis not present

## 2016-08-29 DIAGNOSIS — Z5181 Encounter for therapeutic drug level monitoring: Secondary | ICD-10-CM

## 2016-08-29 DIAGNOSIS — R519 Headache, unspecified: Secondary | ICD-10-CM

## 2016-08-29 DIAGNOSIS — R51 Headache: Secondary | ICD-10-CM | POA: Diagnosis not present

## 2016-08-29 MED ORDER — TOPIRAMATE 25 MG PO TABS: 75.0000 mg | ORAL_TABLET | Freq: Two times a day (BID) | ORAL | 5 refills | Status: DC

## 2016-08-29 NOTE — Progress Notes (Signed)
PATIENT: [name] [name] DOB: 1966/10/10  REASON FOR VISIT: follow up- neurofibromatosis type I, migraine headaches HISTORY FROM: patient  HISTORY OF PRESENT ILLNESS: [name] [name] is a 50 year old female with a history of neurofibromatosis type I and migraine headaches. She returns today for follow-up. She is currently taking Topamax 50 mg twice a day. At the last visit her headaches have increased in frequency therefore tizanidine was increased to 4 mg at bedtime. The patient reports that she was unable to tolerate 4 mg at times evening. She reduced it back to 2 mg and continued on Topamax. She states that she essentially has a daily headache. Her headaches normally occur across the forehead sometimes in the back of the head and in the neck. She confirms photophobia and phonophobia. Denies nausea or vomiting. Denies any changes with her vision. It is questionable in the past that she's had an MRI completed at [facility]? The patient seems unsure. She does report is not been in the last year. She returns today for an evaluation.   HISTORY 04/25/16: [name]. Melvyn [name] is a 50 year old female with a history of neurofibromatosis type I and migraine headaches. She returns today for follow-up. She continues to take Topamax 50 mg twice a day. At the last visit she was started on tizanidine 2 mg at bedtime. She states that she has not noticed much improvement in her headaches since starting tizanidine. She continues to have frequent headaches. Today she has a headache that she describes as a pressure sensation around the head radiating to the neck. She reports some mild photophobia and phonophobia but denies nausea and vomiting. She states that when she does take Tylenol she gets some relief. She returns today for an evaluation.  HISTORY 01/25/16 (MM):[name] [name] is a 50 year old female with a history of neurofibromatosis type I and migraine headaches. She returns today for follow-up. She continues to take Topamax 50  mg twice a day. She reports that her headaches have remained the same. She has a daily headache. She states that occasionally her headache pain will be a 10 out of 10 on the pain scale. Today her head feels like there is a squeezing sensation around her entire head. She also has discomfort that radiates down into the shoulders bilaterally. She states occasionally she'll have some nausea but no vomiting. Denies photophobia or phonophobia. The patient has only tried Topamax for her headaches. She also states that she has some lower back pain that extends across the lower back but does not radiate down the legs. She states this pain is exacerbated with sitting for longer periods or laying on her sides for longer periods at night. She returns today for an evaluation.  HISTORY 11/23/15: [name] CourtMiss [name] is a 50 year old female with a history of neurofibromatosis type I and migraine headaches. She returns today for follow-up. At the last visit she was started on Topamax 75 mg daily. She reports that there is been no change in her headache frequency. She states that she's been tolerating the Topamax well. She continues to wake up and go to bed with a headache. She rates her pain as a 10 out of 10. Her headaches location is usually in the frontal and normally will squeeze like a band around the head down to the shoulders. She does have photophobia and phonophobia. She has nausea but denies vomiting. She does not take any over-the-counter medication for her headaches. She returns today for an evaluation.  HISTORY 08/24/2015 [name] [name]([name] [name]): [name]. Melvyn [name] is a 50 year old  right-handed white female with a history of skin lesions consistent with neurofibromatosis. The patient herself does not know anything about this, she denies any family history of other individuals with similar skin lesions. She is unaware of anyone in the family with the diagnosis of neurofibromatosis. She is not sure how long she has had the neurofibroma. She  indicates that none of the neurofibromas are large, and none of them bother her. She mainly has the spots on her abdomen and arms. The patient does have a history of migraine headaches with recurrent headaches over the last 5 years or more. The headaches are throughout the head. The patient has had virtually daily headaches for greater than 5 years. She has occasionally required injections in the hospital, she indicates that a head scan of some sort was done within the last year at [facility]. She was told that the study was unremarkable. She has frequently 2 headaches a day, lasting up to 3 hours. She may have nausea and vomiting, blurring of vision, but no numbness or weakness of the face, arms, or legs. She denies any particular activators for her headache, and she does have photophobia and phonophobia with headache. She may have to lie down with the headache on occasion. She denies any balance issues, she does report frequent bladder infections. She is sent to this office for an evaluation. She was placed on Topamax previously for headache, but never on a dose greater than 25 mg   REVIEW OF SYSTEMS: Out of a complete 14 system review of symptoms, the patient complains only of the following symptoms, and all other reviewed systems are negative.  ALLERGIES: No Known Allergies  HOME MEDICATIONS: Outpatient Medications Prior to Visit  Medication Sig Dispense Refill  . escitalopram (LEXAPRO) 20 MG tablet Take 20 mg by mouth daily.    Marland Kitchen HYDROcodone-acetaminophen (NORCO/VICODIN) 5-325 MG tablet TAKE 1 TABLET BY MOUTH EVERY FOUR HOURS AS NEEDED ABDOMINAL PAIN  0  . levothyroxine (SYNTHROID, LEVOTHROID) 50 MCG tablet Take 50 mcg by mouth daily.  1  . omeprazole (PRILOSEC) 20 MG capsule Take 20 mg by mouth daily.    . ranitidine (ZANTAC) 150 MG tablet 150 mg 2 (two) times daily.  0  . solifenacin (VESICARE) 5 MG tablet Take 5 mg by mouth daily.    Marland Kitchen tiZANidine (ZANAFLEX) 2 MG tablet Take 2 tablets  (4 mg total) by mouth at bedtime. 60 tablet 3  . topiramate (TOPAMAX) 25 MG tablet Take 2 tablets (50 mg total) by mouth 2 (two) times daily. 120 tablet 3   No facility-administered medications prior to visit.     PAST MEDICAL HISTORY: Past Medical History:  Diagnosis Date  . Anxiety   . Back pain   . Breast mass   . Common migraine with intractable migraine 08/24/2015  . Depression   . Dysphagia   . GERD (gastroesophageal reflux disease)   . Headache   . Hypothyroid   . Neurofibromatosis (HCC)   . 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 Vertigo     PAST SURGICAL HISTORY: Past Surgical History:  Procedure Laterality Date  . BLADDER SURGERY    . CHOLECYSTECTOMY    . TUBAL LIGATION      FAMILY HISTORY: Family History  Problem Relation Age of Onset  . Breast cancer Mother   . Hyperlipidemia Father   . Hypertension Father   . CAD Father   . Breast cancer Sister   . Breast cancer Paternal Aunt   . CAD  Brother   . Neurofibromatosis Neg Hx     SOCIAL HISTORY: Social History   Social History  . Marital status: Married    Spouse name: N/A  . Number of children: 2  . Years of education: 12   Occupational History  . unemployed    Social History Main Topics  . Smoking status: Former Smoker    Quit date: 12/12/1988  . Smokeless tobacco: Never Used  . Alcohol use No  . Drug use: No  . Sexual activity: Not on file   Other Topics Concern  . Not on file   Social History Narrative   Patient drinks 2 cups of caffeine daily.   Patient is right handed.      PHYSICAL EXAM  Vitals:   08/29/16 1427  BP: 96/62  Pulse: 77  Weight: 134 lb 6.4 oz (61 kg)  Height: 5\' 6"  (1.676 m)   Body mass index is 21.69 kg/m.  Generalized: Well developed, in no acute distress   Neurological examination  Mentation: Alert oriented to time, place, history taking. Follows all commands speech and language fluent Cranial nerve II-XII: Pupils were equal round reactive to  light. Extraocular movements were full, visual field were full on confrontational test. Facial sensation and strength were normal. Uvula tongue midline. Head turning and shoulder shrug  were normal and symmetric. Motor: The motor testing reveals 5 over 5 strength of all 4 extremities. Good symmetric motor tone is noted throughout.  Sensory: Sensory testing is intact to soft touch on all 4 extremities. No evidence of extinction is noted.  Coordination: Cerebellar testing reveals good finger-nose-finger and heel-to-shin bilaterally.  Gait and station: Gait is normal. Tandem gait is normal. Romberg is negative. No drift is seen.  Reflexes: Deep tendon reflexes are symmetric and normal bilaterally.   DIAGNOSTIC DATA (LABS, IMAGING, TESTING) - I reviewed patient records, labs, notes, testing and imaging myself where available.     ASSESSMENT AND PLAN 50 y.o. year old female  has a past medical history of Anxiety; Back pain; Breast mass; Common migraine with intractable migraine (08/24/2015); Depression; Dysphagia; GERD (gastroesophageal reflux disease); Headache; Hypothyroid; Neurofibromatosis (HCC); Neurofibromatosis, type 1 (HCC) (08/24/2015); and Vertigo. here with:  1. Neurofibromatosis type I 2. Headache  The patient continues to have frequent headaches. She was unable to tolerate higher doses of tizanidine. I will increase Topamax to 75 mg twice a day. I will also check an MRI of the brain to look for any acute changes that could be contributing to her ongoing headaches. Her physical exam was relatively unremarkable. She will follow-up in 6 months with Dr. Anne [name].   Butch Penny, MSN, NP-C 08/29/2016, 2:21 PM [facility] [address] [place], [place] [address] [phone]

## 2016-08-29 NOTE — Patient Instructions (Signed)
Increase Topamax to 75 mg twice a day MRI brain If your symptoms worsen or you develop new symptoms please let us know.

## 2016-08-30 LAB — CBC WITH DIFFERENTIAL/PLATELET
BASOS ABS: 0.1 10*3/uL (ref 0.0–0.2)
Basos: 1 %
EOS (ABSOLUTE): 0.1 10*3/uL (ref 0.0–0.4)
Eos: 2 %
HEMOGLOBIN: 11.8 g/dL (ref 11.1–15.9)
Hematocrit: 34.4 % (ref 34.0–46.6)
IMMATURE GRANS (ABS): 0 10*3/uL (ref 0.0–0.1)
Immature Granulocytes: 0 %
LYMPHS: 41 %
Lymphocytes Absolute: 2 10*3/uL (ref 0.7–3.1)
MCH: 30.1 pg (ref 26.6–33.0)
MCHC: 34.3 g/dL (ref 31.5–35.7)
MCV: 88 fL (ref 79–97)
MONOCYTES: 8 %
Monocytes Absolute: 0.4 10*3/uL (ref 0.1–0.9)
Neutrophils Absolute: 2.3 10*3/uL (ref 1.4–7.0)
Neutrophils: 48 %
Platelets: 226 10*3/uL (ref 150–379)
RBC: 3.92 x10E6/uL (ref 3.77–5.28)
RDW: 14.5 % (ref 12.3–15.4)
WBC: 4.9 10*3/uL (ref 3.4–10.8)

## 2016-08-30 LAB — COMPREHENSIVE METABOLIC PANEL
ALK PHOS: 92 IU/L (ref 39–117)
ALT: 17 IU/L (ref 0–32)
AST: 20 IU/L (ref 0–40)
Albumin/Globulin Ratio: 1.2 (ref 1.2–2.2)
Albumin: 3.9 g/dL (ref 3.5–5.5)
BUN/Creatinine Ratio: 13 (ref 9–23)
BUN: 13 mg/dL (ref 6–24)
Bilirubin Total: 0.7 mg/dL (ref 0.0–1.2)
CALCIUM: 9.3 mg/dL (ref 8.7–10.2)
CO2: 25 mmol/L (ref 18–29)
CREATININE: 0.99 mg/dL (ref 0.57–1.00)
Chloride: 104 mmol/L (ref 96–106)
GFR calc Af Amer: 77 mL/min/{1.73_m2} (ref >59)
GFR calc non Af Amer: 67 mL/min/{1.73_m2} (ref >59)
Globulin, Total: 3.2 g/dL (ref 1.5–4.5)
Glucose: 84 mg/dL (ref 65–99)
Potassium: 4.7 mmol/L (ref 3.5–5.2)
Sodium: 144 mmol/L (ref 134–144)
TOTAL PROTEIN: 7.1 g/dL (ref 6.0–8.5)

## 2016-08-31 ENCOUNTER — Telehealth: Payer: Self-pay | Admitting: *Deleted

## 2016-08-31 NOTE — Telephone Encounter (Signed)
Spoke to pt and relayed normal lab results.   She verbalized understanding.

## 2016-08-31 NOTE — Telephone Encounter (Signed)
-----   Message from Butch PennyMegan Millikan, NP sent at 08/30/2016  1:39 PM EDT ----- Lab work unremarkable. Please call patient

## 2016-09-02 NOTE — Progress Notes (Signed)
I reviewed note and agree with plan.   VIKRAM R. PENUMALLI, MD  Certified in Neurology, Neurophysiology and Neuroimaging  [facility] [address], [address] [place], [place] [address] [phone]   

## 2016-09-12 ENCOUNTER — Ambulatory Visit
Admission: RE | Admit: 2016-09-12 | Discharge: 2016-09-12 | Disposition: A | Discharge status: Home / Self Care | Payer: Medicaid Other | Source: Ambulatory Visit | Attending: Adult Health | Admitting: Adult Health

## 2016-09-12 DIAGNOSIS — Q8501 Neurofibromatosis, type 1: Secondary | ICD-10-CM | POA: Diagnosis not present

## 2016-09-12 DIAGNOSIS — R51 Headache: Secondary | ICD-10-CM

## 2016-09-12 DIAGNOSIS — R519 Headache, unspecified: Secondary | ICD-10-CM

## 2016-09-12 MED ORDER — GADOBENATE DIMEGLUMINE 529 MG/ML IV SOLN
11.0000 mL | Freq: Once | INTRAVENOUS | Status: AC | PRN
  Administered 2016-09-12: 11 mL via INTRAVENOUS

## 2016-09-13 ENCOUNTER — Telehealth: Payer: Self-pay

## 2016-09-13 ENCOUNTER — Telehealth: Payer: Self-pay | Admitting: Neurology

## 2016-09-13 NOTE — Telephone Encounter (Signed)
-----   Message from Butch PennyMegan Millikan, NP sent at 09/13/2016  8:03 AM EDT ----- Normal MRI. Please call patient.

## 2016-09-13 NOTE — Telephone Encounter (Signed)
Patient called to request results of MRI °

## 2016-09-13 NOTE — Telephone Encounter (Signed)
Rn call patient about her MRI. Rn stated MRI was normal. Pt verbalized understanding.

## 2016-09-13 NOTE — Telephone Encounter (Signed)
yes

## 2016-09-13 NOTE — Telephone Encounter (Signed)
Called pt w/ normal MRI results. Verbalized understanding and appreciation for call. 

## 2017-03-06 ENCOUNTER — Encounter (INDEPENDENT_AMBULATORY_CARE_PROVIDER_SITE_OTHER): Payer: Self-pay

## 2017-03-06 ENCOUNTER — Encounter: Payer: Self-pay | Admitting: Neurology

## 2017-03-06 ENCOUNTER — Ambulatory Visit (INDEPENDENT_AMBULATORY_CARE_PROVIDER_SITE_OTHER): Payer: Medicaid Other | Admitting: Neurology

## 2017-03-06 VITALS — BP 114/65 | HR 72 | Ht 66.0 in | Wt 143.5 lb

## 2017-03-06 DIAGNOSIS — Q8501 Neurofibromatosis, type 1: Secondary | ICD-10-CM

## 2017-03-06 DIAGNOSIS — G43019 Migraine without aura, intractable, without status migrainosus: Secondary | ICD-10-CM

## 2017-03-06 MED ORDER — NORTRIPTYLINE HCL 10 MG PO CAPS: ORAL_CAPSULE | ORAL | 3 refills | Status: DC

## 2017-03-06 NOTE — Patient Instructions (Signed)
   With the Topamax 25 mg tablet begin one at night for 2 weeks, then stop the medication.  We will start Nortriptyline at night for the headache.  Go to 1/2 tablet of the Lexapro for 2 weeks, then stop.   Pamelor (nortriptyline) is an antidepressant medication that has many uses that may include headache, whiplash injuries, or for peripheral neuropathy pain. Side effects may include drowsiness, dry mouth, blurred vision, or constipation. As with any antidepressant medication, worsening depression may occur. If you had any significant side effects, please call our office. The full effects of this medication may take 7-10 days after starting the drug, or going up on the dose.

## 2017-03-06 NOTE — Progress Notes (Signed)
Reason for visit: Headache  [name] [name] is an 51 y.o. female  History of present illness:  [name] [name] is a 51 year old right-handed white female with a history of type I neurofibromatosis and a history of chronic daily intractable headaches. The patient has been on Topamax for quite some time, taking 50 mg night. The patient claimed that this medication is not well tolerated, and makes her nauseated. The patient may have some photophobia with the headaches, but the headaches are there all day long. The headaches are behind the eyes, she may have some neck stiffness as well. She claims that she is sleeping relatively well. The patient may have some tingling sensations in the back of the neck with the headache. She does not work, she is able to get into a dark room if she ever needs to for the headache. She returns for an evaluation.  Past Medical History:  Diagnosis Date  . Anxiety   . Back pain   . Breast mass   . Common migraine with intractable migraine 08/24/2015  . Depression   . Dysphagia   . GERD (gastroesophageal reflux disease)   . Headache   . Hypothyroid   . Neurofibromatosis (HCC)   . 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 Vertigo     Past Surgical History:  Procedure Laterality Date  . BLADDER SURGERY    . CHOLECYSTECTOMY    . TUBAL LIGATION      Family History  Problem Relation Age of Onset  . Breast cancer Mother   . Hyperlipidemia Father   . Hypertension Father   . CAD Father   . Breast cancer Sister   . CAD Brother   . Breast cancer Paternal Aunt   . Neurofibromatosis Neg Hx     Social history:  reports that she quit smoking about 28 years ago. She has never used smokeless tobacco. She reports that she does not drink alcohol or use drugs.   No Known Allergies  Medications:  Prior to Admission medications   Medication Sig Start Date End Date Taking? Authorizing Provider  escitalopram (LEXAPRO) 20 MG tablet Take 20 mg by mouth daily.   Yes  Historical Provider, MD  levothyroxine (SYNTHROID, LEVOTHROID) 50 MCG tablet Take 50 mcg by mouth daily. 03/15/16  Yes Historical Provider, MD  omeprazole (PRILOSEC) 20 MG capsule Take 20 mg by mouth daily.   Yes Historical Provider, MD  solifenacin (VESICARE) 5 MG tablet Take 5 mg by mouth daily.   Yes Historical Provider, MD  nortriptyline (PAMELOR) 10 MG capsule Take one capsule at night for one week, then take 2 capsules at night for one week, then take 3 capsules at night 03/06/17   York Spaniel, MD  topiramate (TOPAMAX) 25 MG tablet Take 3 tablets (75 mg total) by mouth 2 (two) times daily. Patient not taking: Reported on 03/06/2017 08/29/16   Butch Penny, NP    ROS:  Out of a complete 14 system review of symptoms, the patient complains only of the following symptoms, and all other reviewed systems are negative.  Headache Neck stiffness    Blood pressure 114/65, pulse 72, height 5\' 6"  (1.676 m), weight 143 lb 8 oz (65.1 kg).  Physical Exam  General: The patient is alert and cooperative at the time of the examination.  Neuromuscular: Range of movement of the cervical spine is full  Skin: No significant peripheral edema is noted.   Neurologic Exam  Mental status: The patient is alert and  oriented x 3 at the time of the examination. The patient has apparent normal recent and remote memory, with an apparently normal attention span and concentration ability.   Cranial nerves: Facial symmetry is present. Speech is normal, no aphasia or dysarthria is noted. Extraocular movements are full. Visual fields are full.  Motor: The patient has good strength in all 4 extremities.  Sensory examination: Soft touch sensation is symmetric on the face, arms, and legs.  Coordination: The patient has good finger-nose-finger and heel-to-shin bilaterally.  Gait and station: The patient has a normal gait. Tandem gait is normal. Romberg is negative. No drift is seen.  Reflexes: Deep tendon  reflexes are symmetric   MRI brain 09/12/16:  IMPRESSION:  This is a normal MRI of the brain with and without contrast.  * MRI scan images were reviewed online. I agree with the written report.    Assessment/Plan:   1. Intractable headache  2. Neurofibromatosis  MRI of the head done previously was normal. The patient is not tolerating the Topamax, she is only on 50 mg daily, we will need to taper off the medication and start nortriptyline for the headache and for the neck pain. The patient will be tapered off of Lexapro. She will follow-up in about 4 months.    Marlan Palau. Keith Kareli Hossain MD 03/06/2017 2:56 PM  [facility] [address] [place], [place] [id number]  Phone [phone] Fax [phone]

## 2017-06-12 ENCOUNTER — Encounter: Payer: Self-pay | Admitting: Adult Health

## 2017-06-12 ENCOUNTER — Ambulatory Visit (INDEPENDENT_AMBULATORY_CARE_PROVIDER_SITE_OTHER): Payer: Medicaid Other | Admitting: Adult Health

## 2017-06-12 VITALS — BP 104/62 | HR 72 | Wt 139.0 lb

## 2017-06-12 DIAGNOSIS — R51 Headache: Secondary | ICD-10-CM

## 2017-06-12 DIAGNOSIS — R519 Headache, unspecified: Secondary | ICD-10-CM

## 2017-06-12 DIAGNOSIS — Q8501 Neurofibromatosis, type 1: Secondary | ICD-10-CM | POA: Diagnosis not present

## 2017-06-12 NOTE — Progress Notes (Signed)
PATIENT: BHAVYA ESCHETE DOB: 1966-02-01  REASON FOR VISIT: follow up- neurofibromatosis type I, daily headaches HISTORY FROM: patient  HISTORY OF PRESENT ILLNESS: Ms. Prisco is a 51 year old female with a history of neuro fibromatosis type I and daily headaches. She returns today for follow-up. She remains on Topamax however the last visit she was started on nortriptyline working up to 30 mg at bedtime. She states that 30 mg made it hard for her to sleep. She reduced down to 10 mg. She states that she continues to have daily headaches. With her headache she has photophobia and phonophobia. She also reports neck pain and sometimes has a tingling sensation in the neck that radiates to both shoulders. She had an MRI of the brain in the past that was relatively unremarkable. She denies any injury to the neck. She returns today for an evaluation.  HISTORY 03/06/17: Ms. Imes is a 51 year old right-handed white female with a history of type I neurofibromatosis and a history of chronic daily intractable headaches. The patient has been on Topamax for quite some time, taking 50 mg night. The patient claimed that this medication is not well tolerated, and makes her nauseated. The patient may have some photophobia with the headaches, but the headaches are there all day long. The headaches are behind the eyes, she may have some neck stiffness as well. She claims that she is sleeping relatively well. The patient may have some tingling sensations in the back of the neck with the headache. She does not work, she is able to get into a dark room if she ever needs to for the headache. She returns for an evaluation.   REVIEW OF SYSTEMS: Out of a complete 14 system review of symptoms, the patient complains only of the following symptoms, and all other reviewed systems are negative.  Headache, back pain, muscle cramps, neck pain, restless leg, chills  ALLERGIES: No Known Allergies  HOME MEDICATIONS: Outpatient  Medications Prior to Visit  Medication Sig Dispense Refill  . escitalopram (LEXAPRO) 20 MG tablet Take 20 mg by mouth daily.    Marland Kitchen levothyroxine (SYNTHROID, LEVOTHROID) 50 MCG tablet Take 50 mcg by mouth daily.  1  . nortriptyline (PAMELOR) 10 MG capsule Take one capsule at night for one week, then take 2 capsules at night for one week, then take 3 capsules at night 90 capsule 3  . omeprazole (PRILOSEC) 20 MG capsule Take 20 mg by mouth daily.    . solifenacin (VESICARE) 5 MG tablet Take 5 mg by mouth daily.    Marland Kitchen topiramate (TOPAMAX) 25 MG tablet Take 3 tablets (75 mg total) by mouth 2 (two) times daily. 180 tablet 5   No facility-administered medications prior to visit.     PAST MEDICAL HISTORY: Past Medical History:  Diagnosis Date  . Anxiety   . Back pain   . Breast mass   . Common migraine with intractable migraine 08/24/2015  . Depression   . Dysphagia   . GERD (gastroesophageal reflux disease)   . Headache   . Hypothyroid   . Neurofibromatosis (HCC)   . 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 Vertigo     PAST SURGICAL HISTORY: Past Surgical History:  Procedure Laterality Date  . BLADDER SURGERY    . CHOLECYSTECTOMY    . TUBAL LIGATION      FAMILY HISTORY: Family History  Problem Relation Age of Onset  . Breast cancer Mother   . Hyperlipidemia Father   . Hypertension Father   .  CAD Father   . Breast cancer Sister   . CAD Brother   . Breast cancer Paternal Aunt   . Neurofibromatosis Neg Hx     SOCIAL HISTORY: Social History   Social History  . Marital status: Married    Spouse name: N/A  . Number of children: 2  . Years of education: 12   Occupational History  . unemployed    Social History Main Topics  . Smoking status: Former Smoker    Quit date: 12/12/1988  . Smokeless tobacco: Never Used  . Alcohol use No  . Drug use: No  . Sexual activity: Not on file   Other Topics Concern  . Not on file   Social History Narrative   Patient drinks 2  cups of caffeine daily.   Patient is right handed.      PHYSICAL EXAM  Vitals:   06/12/17 1356  BP: 104/62  Pulse: 72  Weight: 139 lb (63 kg)   Body mass index is 22.44 kg/m.  Generalized: Well developed, in no acute distress   Neurological examination  Mentation: Alert oriented to time, place, history taking. Follows all commands speech and language fluent Cranial nerve II-XII: Pupils were equal round reactive to light. Extraocular movements were full, visual field were full on confrontational test. Facial sensation and strength were normal. Uvula tongue midline. Head turning and shoulder shrug  were normal and symmetric. Motor: The motor testing reveals 5 over 5 strength of all 4 extremities. Good symmetric motor tone is noted throughout.  Sensory: Sensory testing is intact to soft touch on all 4 extremities. No evidence of extinction is noted.  Coordination: Cerebellar testing reveals good finger-nose-finger and heel-to-shin bilaterally.  Gait and station: Gait is normal. Tandem gait is normal. Romberg is negative. No drift is seen.  Reflexes: Deep tendon reflexes are symmetric and normal bilaterally.   DIAGNOSTIC DATA (LABS, IMAGING, TESTING) - I reviewed patient records, labs, notes, testing and imaging myself where available.  Lab Results  Component Value Date   WBC 4.9 08/29/2016   HGB 11.8 08/29/2016   HCT 34.4 08/29/2016   MCV 88 08/29/2016   PLT 226 08/29/2016      Component Value Date/Time   NA 144 08/29/2016 1524   K 4.7 08/29/2016 1524   CL 104 08/29/2016 1524   CO2 25 08/29/2016 1524   GLUCOSE 84 08/29/2016 1524   BUN 13 08/29/2016 1524   CREATININE 0.99 08/29/2016 1524   CALCIUM 9.3 08/29/2016 1524   PROT 7.1 08/29/2016 1524   ALBUMIN 3.9 08/29/2016 1524   AST 20 08/29/2016 1524   ALT 17 08/29/2016 1524   ALKPHOS 92 08/29/2016 1524   BILITOT 0.7 08/29/2016 1524   GFRNONAA 67 08/29/2016 1524   GFRAA 77 08/29/2016 1524      ASSESSMENT AND  PLAN 51 y.o. year old female  has a past medical history of Anxiety; Back pain; Breast mass; Common migraine with intractable migraine (08/24/2015); Depression; Dysphagia; GERD (gastroesophageal reflux disease); Headache; Hypothyroid; Neurofibromatosis (HCC); Neurofibromatosis, type 1 (HCC) (08/24/2015); and Vertigo. here with:  1. Daily headaches 2. Neurofibromatosis   Patient continues to have daily headaches. I advised patient to try increasing nortriptyline to 20 mg. Typically nortriptyline does not cause insomnia rather promotes sleep. I advised that she should try this for several weeks. Also encouraged her to keep a headache journal. She will continue on Topamax for now. I advised that if her symptoms worsen or she develops new symptoms she should let us  know. She will follow-up in 6 months or sooner if needed.    Butch Penny, MSN, NP-C 06/12/2017, 2:11 PM [facility] [address] [place], [place] [address] [phone]

## 2017-06-12 NOTE — Progress Notes (Signed)
I have read the note, and I agree with the clinical assessment and plan.  Faye Strohman KEITH   

## 2017-06-12 NOTE — Patient Instructions (Signed)
Your Plan:  Increase Nortriptyline to 20 mg at bedtime Keep headache journal  To prevent or relieve headaches, try the following: Cool Compress. Lie down and place a cool compress on your head.  Avoid headache triggers. If certain foods or odors seem to have triggered your migraines in the past, avoid them. A headache diary might help you identify triggers.  Include physical activity in your daily routine. Try a daily walk or other moderate aerobic exercise.  Manage stress. Find healthy ways to cope with the stressors, such as delegating tasks on your to-do list.  Practice relaxation techniques. Try deep breathing, yoga, massage and visualization.  Eat regularly. Eating regularly scheduled meals and maintaining a healthy diet might help prevent headaches. Also, drink plenty of fluids.  Follow a regular sleep schedule. Sleep deprivation might contribute to headaches Consider biofeedback. With this mind-body technique, you learn to control certain bodily functions - such as muscle tension, heart rate and blood pressure - to prevent headaches or reduce headache pain.   Proceed to emergency room if you experience new or worsening symptoms or symptoms do not resolve, if you have new neurologic symptoms or if headache is severe, or for any concerning symptom.   Provided education and documentation from American headache Society toolbox including articles on: chronic migraine medication overuse headache, chronic migraines, prevention of migraines, behavioral and other nonpharmacologic treatments for headache.   Thank you for coming [facility] see [facility]. I hope we have been able to provide you high quality care today.  You may receive a patient satisfaction survey over the next few weeks. We would appreciate your feedback and comments so that we may continue to improve ourselves and the health of our patients.

## 2017-12-12 HISTORY — PX: COLONOSCOPY: SHX174

## 2017-12-25 ENCOUNTER — Ambulatory Visit: Payer: Medicaid Other | Admitting: Adult Health

## 2018-01-19 ENCOUNTER — Encounter: Payer: Self-pay | Admitting: Gastroenterology

## 2018-03-12 HISTORY — PX: COLONOSCOPY: SHX174

## 2018-04-02 ENCOUNTER — Ambulatory Visit: Payer: Medicaid Other | Admitting: Adult Health

## 2018-05-08 ENCOUNTER — Encounter: Payer: Self-pay | Admitting: Gastroenterology

## 2018-07-26 ENCOUNTER — Ambulatory Visit: Payer: Self-pay | Admitting: Gastroenterology

## 2018-07-31 ENCOUNTER — Ambulatory Visit: Payer: Medicaid Other | Admitting: Adult Health

## 2018-07-31 ENCOUNTER — Encounter

## 2018-10-22 ENCOUNTER — Encounter: Payer: Self-pay | Admitting: Gastroenterology

## 2018-10-22 ENCOUNTER — Ambulatory Visit: Payer: Medicaid Other | Admitting: Gastroenterology

## 2018-10-22 VITALS — BP 110/66 | HR 80 | Temp 97.2°F | Ht 66.0 in | Wt 143.4 lb

## 2018-10-22 DIAGNOSIS — K5900 Constipation, unspecified: Secondary | ICD-10-CM | POA: Insufficient documentation

## 2018-10-22 DIAGNOSIS — R109 Unspecified abdominal pain: Secondary | ICD-10-CM | POA: Insufficient documentation

## 2018-10-22 DIAGNOSIS — R1030 Lower abdominal pain, unspecified: Secondary | ICD-10-CM | POA: Diagnosis not present

## 2018-10-22 NOTE — Assessment & Plan Note (Signed)
Start Linzess 290 mcg daily. Samples provided.

## 2018-10-22 NOTE — Assessment & Plan Note (Addendum)
52 year old female with chronic lower abdominal pain, colonoscopy on file earlier this year requested, without any aggravating or relieving factors. Notably, dealing with constipation, which is likely playing a role. Start Linzess 290 mcg once daily. Samples provided. Call in 1 week with update. If no improvement, she will need CT abd/pelvis with oral and IV contrast. Return in 3 months. Attempt to obtain TCS reports from Dr. Marcha Solders from earlier this year.   Received colonoscopy by Dr. Marcha Solders dated April 2019. Normal.

## 2018-10-22 NOTE — Progress Notes (Signed)
Primary Care Physician:  Royann Shivers, PA-C/Terry Reuel Boom, MD Primary Gastroenterologist:  Dr. Darrick Penna   Chief Complaint  Patient presents with  . Abdominal Pain    "all over" going on for a few months. Pain seems to be constant, feels like tightness is there  . Constipation    HPI:   [name] [name] is a 52 y.o. female presenting today at the request of Dr. Reuel Boom secondary to abdominal pain.   Lower abdominal pain, intermittent. Chronic. Unable to tell me when this started, but I note an ultrasound was completed in Feb 2019 due to symptoms. Normal.  Nothing relieves. No related to eating/drinking. Feels more on constipated side. BMs not daily. Taking an OTC agent, small tiny pill in a white bottle. Just started taking the pills. Some straining. No rectal bleeding. No N/V. No weight loss. Appetite not very good. Had kidney infection about 2 weeks ago. Improving from this.   Colonoscopy earlier this year by Dr. Marcha Solders. Requesting results.   Past Medical History:  Diagnosis Date  . Anxiety   . Back pain   . Breast mass   . Common migraine with intractable migraine 08/24/2015  . Depression   . Dysphagia   . GERD (gastroesophageal reflux disease)   . Headache   . Hypothyroid   . Neurofibromatosis (HCC)   . 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 Vertigo     Past Surgical History:  Procedure Laterality Date  . BLADDER SURGERY    . CHOLECYSTECTOMY    . TUBAL LIGATION      Current Outpatient Medications  Medication Sig Dispense Refill  . acetaminophen (TYLENOL) 325 MG tablet Take 650 mg by mouth every 6 (six) hours as needed.    . docusate sodium (COLACE) 100 MG capsule Take 100 mg by mouth as needed for mild constipation.    Marland Kitchen escitalopram (LEXAPRO) 10 MG tablet Take 10 mg by mouth daily.     . Levothyroxine Sodium 75 MCG CAPS Take 75 mcg by mouth daily.   1  . omeprazole (PRILOSEC) 20 MG capsule Take 20 mg by mouth daily.    . diclofenac (VOLTAREN) 50 MG EC  tablet Take 50 mg by mouth 3 (three) times daily as needed. for pain  0  . nortriptyline (PAMELOR) 10 MG capsule Take one capsule at night for one week, then take 2 capsules at night for one week, then take 3 capsules at night (Patient not taking: Reported on 10/22/2018) 90 capsule 3  . solifenacin (VESICARE) 5 MG tablet Take 5 mg by mouth daily.    Marland Kitchen topiramate (TOPAMAX) 25 MG tablet Take 3 tablets (75 mg total) by mouth 2 (two) times daily. (Patient not taking: Reported on 10/22/2018) 180 tablet 5   No current facility-administered medications for this visit.     Allergies as of 10/22/2018  . (No Known Allergies)    Family History  Problem Relation Age of Onset  . Breast cancer Mother   . Hyperlipidemia Father   . Hypertension Father   . CAD Father   . Breast cancer Sister   . CAD Brother   . Breast cancer Paternal Aunt   . Neurofibromatosis Neg Hx     Social History   Socioeconomic History  . Marital status: Married    Spouse name: Not on file  . Number of children: 2  . Years of education: 35  . Highest education level: Not on file  Occupational History  . Occupation: unemployed  Social Needs  . Financial resource strain: Not on file  . Food insecurity:    Worry: Not on file    Inability: Not on file  . Transportation needs:    Medical: Not on file    Non-medical: Not on file  Tobacco Use  . Smoking status: Former Smoker    Last attempt to quit: 12/12/1988    Years since quitting: 29.8  . Smokeless tobacco: Never Used  Substance and Sexual Activity  . Alcohol use: No  . Drug use: No  . Sexual activity: Not on file  Lifestyle  . Physical activity:    Days per week: Not on file    Minutes per session: Not on file  . Stress: Not on file  Relationships  . Social connections:    Talks on phone: Not on file    Gets together: Not on file    Attends religious service: Not on file    Active member of club or organization: Not on file    Attends meetings of clubs  or organizations: Not on file    Relationship status: Not on file  . Intimate partner violence:    Fear of current or ex partner: Not on file    Emotionally abused: Not on file    Physically abused: Not on file    Forced sexual activity: Not on file  Other Topics Concern  . Not on file  Social History Narrative   Patient drinks 2 cups of caffeine daily.   Patient is right handed.    Review of Systems: Gen: Denies any fever, chills, fatigue, weight loss, lack of appetite.  CV: Denies chest pain, heart palpitations, peripheral edema, syncope.  Resp: Denies shortness of breath at rest or with exertion. Denies wheezing or cough.  GI: see HPI  GU : Denies urinary burning, urinary frequency, urinary hesitancy MS: Denies joint pain, muscle weakness, cramps, or limitation of movement.  Derm: Denies rash, itching, dry skin Psych: Denies depression, anxiety, memory loss, and confusion Heme: Denies bruising, bleeding, and enlarged lymph nodes.  Physical Exam: BP 110/66   Pulse 80   Temp (!) 97.2 F (36.2 C) (Oral)   Ht 5\' 6"  (1.676 m)   Wt 143 lb 6.4 oz (65 kg)   BMI 23.15 kg/m  General:   Alert and oriented. Pleasant and cooperative. Well-nourished and well-developed.  Head:  Normocephalic and atraumatic. Eyes:  Without icterus, sclera clear and conjunctiva pink.  Ears:  Normal auditory acuity. Nose:  No deformity, discharge,  or lesions. Mouth:  No deformity or lesions, oral mucosa pink.  Lungs:  Clear to auscultation bilaterally. No wheezes, rales, or rhonchi. No distress.  Heart:  S1, S2 present without murmurs appreciated.  Abdomen:  +BS, soft, TTP lower abdomen moreso than upper abdomen and non-distended. No HSM noted. No guarding or rebound. No masses appreciated.  Rectal:  Deferred  Msk:  Symmetrical without gross deformities. Normal posture. Extremities:  Without  edema. Neurologic:  Alert and  oriented x Skin:  Intact without significant lesions or rashes. Psych:  Alert  and cooperative. Normal mood and affect.

## 2018-10-22 NOTE — Patient Instructions (Signed)
I would like for you to start taking Linzess one capsule, 30 minutes before breakfast daily. This may cause some loose stool at first, but it should get better. If not better after 4-5 days, call me.  Let us know how you are doing next week. If not improved next week, we will order a CT scan. If you get worse this week, we will pursue a CT scan as well.  It was a pleasure to see you today. I strive to create trusting relationships with patients to provide genuine, compassionate, and quality care. I value your feedback. If you receive a survey regarding your visit,  I greatly appreciate you taking time to fill this out.   Gelene Mink, PhD, ANP-BC [facility]

## 2018-10-23 NOTE — Progress Notes (Signed)
CC'ED TO PCP 

## 2018-12-13 ENCOUNTER — Telehealth: Payer: Self-pay | Admitting: Gastroenterology

## 2018-12-13 NOTE — Telephone Encounter (Signed)
PATIENT CALLED AND SAID SHE IS STILL HAVING ABDOMINAL PAIN SINCE HER LAST VISIT.  [phone]  PLEASE CALL

## 2018-12-13 NOTE — Telephone Encounter (Signed)
I would have her pick up another couple boxes of Linzess 290 mcg and advise her to take it ONCE A DAY, EVERY DAY. Call us when she completes the samples so we can get a true picture on if it is helping. If it is not effective, we can try other options.  Her abdominal pain is likely due to her known constipation.

## 2018-12-13 NOTE — Telephone Encounter (Signed)
Pt was seen in the office on 10/22/2018 by Lewie Loron, NP. She was given samples of Linzess 290 mcg and told to call back in a week and let us know if it helped. She said she got busy and has been sick and she forgot to call back.  She said it helped some and she thinks she still has one more capsule. ( She was only given #12 tablets at [facility]). She states she goes a couple of days sometimes without a BM and the stool is hard.  Her stomach hurts all over, but especially across the belly button area, and cramps in the bottom.  She is aware that Tobi Bastos has left for the day. Forwarding to Minerva Areola who is doing hospital call to see if he has any recommendations.

## 2018-12-13 NOTE — Telephone Encounter (Signed)
Pt is aware we are leaving her Linzess 290 mcg to make sure she takes daily and then let us know how it does. I am leaving #8 for her.

## 2019-01-23 ENCOUNTER — Ambulatory Visit: Payer: Medicaid Other | Admitting: Gastroenterology

## 2019-04-08 ENCOUNTER — Ambulatory Visit (INDEPENDENT_AMBULATORY_CARE_PROVIDER_SITE_OTHER): Payer: Medicaid Other | Admitting: Gastroenterology

## 2019-04-08 ENCOUNTER — Telehealth: Payer: Self-pay

## 2019-04-08 ENCOUNTER — Other Ambulatory Visit: Payer: Self-pay

## 2019-04-08 ENCOUNTER — Encounter: Payer: Self-pay | Admitting: Gastroenterology

## 2019-04-08 DIAGNOSIS — K5900 Constipation, unspecified: Secondary | ICD-10-CM

## 2019-04-08 DIAGNOSIS — R1310 Dysphagia, unspecified: Secondary | ICD-10-CM | POA: Diagnosis not present

## 2019-04-08 DIAGNOSIS — R1030 Lower abdominal pain, unspecified: Secondary | ICD-10-CM

## 2019-04-08 MED ORDER — LUBIPROSTONE 24 MCG PO CAPS
24.0000 ug | ORAL_CAPSULE | Freq: Two times a day (BID) | ORAL | 3 refills | Status: DC
Start: 2019-04-08 — End: 2019-05-15

## 2019-04-08 NOTE — Progress Notes (Addendum)
REVIEWED-NO ADDITIONAL RECOMMENDATIONS.  Primary Care Physician:  Royann ShiversSkillman, Katherine E, PA-C  Primary GI: Dr. Darrick PennaFields   Virtual Visit via Telephone Note Due to COVID-19, visit is conducted virtually and was requested by patient.   I connected with [name] [name] on 04/08/19 at  2:00 PM EDT by telephone and verified that I am speaking with the correct person using two identifiers.   I discussed the limitations, risks, security and privacy concerns of performing an evaluation and management service by telephone and the availability of in person appointments. I also discussed with the patient that there may be a patient responsible charge related to this service. The patient expressed understanding and agreed to proceed.  Chief Complaint  Patient presents with  . Abdominal Pain  . Constipation     History of Present Illness: 53 year old female with chronic lower abdominal, last colonoscopy April 2019. Last seen in Nov 2019. Started on Linzess 290 mcg at visit.   Linzess worked well. Still with issues. BMs are mushy. Unproductive. BM twice per day. Has to sit for long periods of time to have BM. While taking Linzess, less toilet time. Small amount of straining with Linzess. Lower abdominal pain continued despite Linzess. No fever or chills. Appetite is "ok". Unsure if any weight loss. No N/V. Pain comes and goes. Worsened with eating. Early satiety. Nothing relieves pain. Pain worsened with everything she eats. Pain lasts an hour. Pain is 10/10. Difficult to describe pain. "real bad". Feels like she is choking at times with meats. Dysphagia just started a few days ago. Prilosec controls GERD. Remote EGD years ago was awake. She states she was awake for that procedure and nearly "choked to death".   Some dysuria but just started 3 days ago.   Past Medical History:  Diagnosis Date  . Anxiety   . Back pain   . Breast mass   . Common migraine with intractable migraine 08/24/2015  .  Depression   . Dysphagia   . GERD (gastroesophageal reflux disease)   . Headache   . Hypothyroid   . Neurofibromatosis (HCC)   . 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 Vertigo      Past Surgical History:  Procedure Laterality Date  . BLADDER SURGERY    . BREAST LUMPECTOMY    . CHOLECYSTECTOMY    . COLONOSCOPY  2019  . TUBAL LIGATION       Current Meds  Medication Sig  . acetaminophen (TYLENOL) 325 MG tablet Take 650 mg by mouth every 6 (six) hours as needed.  Marland Kitchen. escitalopram (LEXAPRO) 10 MG tablet Take 10 mg by mouth daily.   . Levothyroxine Sodium 75 MCG CAPS Take 75 mcg by mouth daily.   Marland Kitchen. omeprazole (PRILOSEC) 20 MG capsule Take 20 mg by mouth daily.      Review of Systems: Gen: Denies fever, chills, anorexia. Denies fatigue, weakness, weight loss.  CV: Denies chest pain, palpitations, syncope, peripheral edema, and claudication. Resp: Denies dyspnea at rest, cough, wheezing, coughing up blood, and pleurisy. GI: see HPI Derm: Denies rash, itching, dry skin Psych: Denies depression, anxiety, memory loss, confusion. No homicidal or suicidal ideation.  Heme: Denies bruising, bleeding, and enlarged lymph nodes.  Observations/Objective: No distress. Unable to perform physical exam due to telephone encounter. No video available.   Assessment and Plan: 53 year old female with chronic lower abdominal pain, worsened postprandially, continued despite Linzess 290 mcg daily. Linzess not ideally managing constipation. Will trial Amitiza 24 mcg po BID with food.  As previously discussed with patient, will pursue CT abd/pelvis with contrast. Patient is unsure if any weight loss. Low likelihood for chronic mesenteric ischemia but will have radiologist comment on vasculature.   Dysphagia: new onset. GERD controlled with PPI daily. Will arrange EGD/dilatation with Propofol after review of CT.  Follow Up Instructions: We have scheduled you for a CT scan in the near future.   Please start taking Amitiza one gelcap twice a day WITH FOOD to avoid nausea. I sent this to your pharmacy. Please let me know how this works for you!  After review of CT, we will likely be setting you up for an upper endoscopy with Dr. Darrick Penna due to your issues swallowing.    I discussed the assessment and treatment plan with the patient. The patient was provided an opportunity to ask questions and all were answered. The patient agreed with the plan and demonstrated an understanding of the instructions.   The patient was advised to call back or seek an in-person evaluation if the symptoms worsen or if the condition fails to improve as anticipated.  I provided 20 minutes of non-face-to-face time during this encounter.  Gelene Mink, PhD, ANP-BC [facility]

## 2019-04-08 NOTE — Patient Instructions (Addendum)
We have scheduled you for a CT scan in the near future.  Please start taking Amitiza one gelcap twice a day WITH FOOD to avoid nausea. I sent this to your pharmacy. Please let me know how this works for you!  After review of CT, we will likely be setting you up for an upper endoscopy with Dr. Darrick Penna due to your issues swallowing.   I enjoyed talking with you again today! As you know, I value our relationship and want to provide genuine, compassionate, and quality care. I welcome your feedback. If you receive a survey regarding your visit,  I greatly appreciate you taking time to fill this out. See you next time!  Gelene Mink, PhD, ANP-BC [facility]

## 2019-04-08 NOTE — Telephone Encounter (Signed)
PA submitted via [employer] website for CT abd/pelvis w/contrast. Case pending. Service order: [id number]. Clinical notes faxed to [employer].

## 2019-04-09 NOTE — Telephone Encounter (Signed)
CT approved. PA# [id number], 04/08/19-10/05/19.

## 2019-04-09 NOTE — Telephone Encounter (Signed)
Called pt, she requested CT 04/11/19.  CT abd/pelvis w/contrast scheduled for 04/11/19 at 3:00pm, arrive at 2:45pm. NPO 4 hours prior to test. Pick up contrast. Called and informed pt of appt.

## 2019-04-10 NOTE — Progress Notes (Signed)
CC'ED TO PCP 

## 2019-04-11 ENCOUNTER — Other Ambulatory Visit: Payer: Self-pay

## 2019-04-11 ENCOUNTER — Ambulatory Visit (HOSPITAL_COMMUNITY)
Admission: RE | Admit: 2019-04-11 | Discharge: 2019-04-11 | Disposition: A | Discharge status: Home / Self Care | Payer: Medicaid Other | Source: Ambulatory Visit | Attending: Gastroenterology | Admitting: Gastroenterology

## 2019-04-11 DIAGNOSIS — K5900 Constipation, unspecified: Secondary | ICD-10-CM | POA: Insufficient documentation

## 2019-04-11 DIAGNOSIS — R1030 Lower abdominal pain, unspecified: Secondary | ICD-10-CM | POA: Diagnosis present

## 2019-04-11 MED ORDER — IOHEXOL 300 MG/ML  SOLN
100.0000 mL | Freq: Once | INTRAMUSCULAR | Status: AC | PRN
  Administered 2019-04-11: 100 mL via INTRAVENOUS

## 2019-04-11 NOTE — Progress Notes (Signed)
No acute findings on CT. Needs to have EGD/dilatation with Propofol by Dr. Darrick Penna due to dysphagia, early satiety, epigastric pain.

## 2019-04-17 ENCOUNTER — Encounter: Payer: Self-pay | Admitting: *Deleted

## 2019-04-17 ENCOUNTER — Other Ambulatory Visit: Payer: Self-pay | Admitting: *Deleted

## 2019-04-17 DIAGNOSIS — R1013 Epigastric pain: Secondary | ICD-10-CM

## 2019-04-17 DIAGNOSIS — R1310 Dysphagia, unspecified: Secondary | ICD-10-CM

## 2019-04-17 DIAGNOSIS — R6881 Early satiety: Secondary | ICD-10-CM

## 2019-05-01 ENCOUNTER — Encounter (HOSPITAL_COMMUNITY): Payer: Self-pay

## 2019-05-01 ENCOUNTER — Encounter (HOSPITAL_COMMUNITY)
Admission: RE | Admit: 2019-05-01 | Discharge: 2019-05-01 | Disposition: A | Discharge status: Home / Self Care | Payer: Medicaid Other | Source: Ambulatory Visit | Attending: Gastroenterology | Admitting: Gastroenterology

## 2019-05-01 ENCOUNTER — Other Ambulatory Visit: Payer: Self-pay

## 2019-05-01 NOTE — Pre-Procedure Instructions (Signed)
Spoke with patient and she was not set up for her COVID testing. I transferred her to Carloyn Jaeger, OR scheduler to have that set up.

## 2019-05-03 ENCOUNTER — Other Ambulatory Visit: Payer: Self-pay

## 2019-05-03 ENCOUNTER — Other Ambulatory Visit (HOSPITAL_COMMUNITY)
Admission: RE | Admit: 2019-05-03 | Discharge: 2019-05-03 | Disposition: A | Discharge status: Home / Self Care | Payer: Medicaid Other | Source: Ambulatory Visit | Attending: Gastroenterology | Admitting: Gastroenterology

## 2019-05-03 DIAGNOSIS — Z1159 Encounter for screening for other viral diseases: Secondary | ICD-10-CM | POA: Diagnosis present

## 2019-05-04 LAB — NOVEL CORONAVIRUS, NAA (HOSP ORDER, SEND-OUT TO REF LAB; TAT 18-24 HRS): SARS-CoV-2, NAA: NOT DETECTED

## 2019-05-07 ENCOUNTER — Encounter (HOSPITAL_COMMUNITY)
Admission: RE | Disposition: A | Discharge status: Home / Self Care | Payer: Self-pay | Source: Home / Self Care | Attending: Gastroenterology

## 2019-05-07 ENCOUNTER — Encounter (HOSPITAL_COMMUNITY): Payer: Self-pay | Admitting: *Deleted

## 2019-05-07 ENCOUNTER — Ambulatory Visit (HOSPITAL_COMMUNITY): Payer: Medicaid Other | Admitting: Anesthesiology

## 2019-05-07 ENCOUNTER — Other Ambulatory Visit: Payer: Self-pay

## 2019-05-07 ENCOUNTER — Ambulatory Visit (HOSPITAL_COMMUNITY)
Admission: RE | Admit: 2019-05-07 | Discharge: 2019-05-07 | Disposition: A | Discharge status: Home / Self Care | Payer: Medicaid Other | Attending: Gastroenterology | Admitting: Gastroenterology

## 2019-05-07 DIAGNOSIS — Z79899 Other long term (current) drug therapy: Secondary | ICD-10-CM | POA: Diagnosis not present

## 2019-05-07 DIAGNOSIS — Z803 Family history of malignant neoplasm of breast: Secondary | ICD-10-CM | POA: Insufficient documentation

## 2019-05-07 DIAGNOSIS — Z8249 Family history of ischemic heart disease and other diseases of the circulatory system: Secondary | ICD-10-CM | POA: Insufficient documentation

## 2019-05-07 DIAGNOSIS — R1310 Dysphagia, unspecified: Secondary | ICD-10-CM | POA: Diagnosis not present

## 2019-05-07 DIAGNOSIS — K2950 Unspecified chronic gastritis without bleeding: Secondary | ICD-10-CM | POA: Diagnosis not present

## 2019-05-07 DIAGNOSIS — E039 Hypothyroidism, unspecified: Secondary | ICD-10-CM | POA: Diagnosis not present

## 2019-05-07 DIAGNOSIS — K219 Gastro-esophageal reflux disease without esophagitis: Secondary | ICD-10-CM | POA: Insufficient documentation

## 2019-05-07 DIAGNOSIS — Z79890 Hormone replacement therapy (postmenopausal): Secondary | ICD-10-CM | POA: Insufficient documentation

## 2019-05-07 DIAGNOSIS — F329 Major depressive disorder, single episode, unspecified: Secondary | ICD-10-CM | POA: Diagnosis not present

## 2019-05-07 DIAGNOSIS — Q8501 Neurofibromatosis, type 1: Secondary | ICD-10-CM | POA: Diagnosis not present

## 2019-05-07 DIAGNOSIS — F419 Anxiety disorder, unspecified: Secondary | ICD-10-CM | POA: Diagnosis not present

## 2019-05-07 DIAGNOSIS — R1013 Epigastric pain: Secondary | ICD-10-CM | POA: Insufficient documentation

## 2019-05-07 DIAGNOSIS — Z87891 Personal history of nicotine dependence: Secondary | ICD-10-CM | POA: Diagnosis not present

## 2019-05-07 DIAGNOSIS — K2980 Duodenitis without bleeding: Secondary | ICD-10-CM | POA: Insufficient documentation

## 2019-05-07 DIAGNOSIS — R6881 Early satiety: Secondary | ICD-10-CM

## 2019-05-07 HISTORY — PX: SAVORY DILATION: SHX5439

## 2019-05-07 HISTORY — PX: ESOPHAGOGASTRODUODENOSCOPY (EGD) WITH PROPOFOL: SHX5813

## 2019-05-07 HISTORY — PX: BIOPSY: SHX5522

## 2019-05-07 SURGERY — ESOPHAGOGASTRODUODENOSCOPY (EGD) WITH PROPOFOL
Anesthesia: General

## 2019-05-07 MED ORDER — MINERAL OIL PO OIL
TOPICAL_OIL | ORAL | Status: AC
  Filled 2019-05-07: qty 90

## 2019-05-07 MED ORDER — LIDOCAINE VISCOUS HCL 2 % MT SOLN: 10.0000 mL | Freq: Once | OROMUCOSAL | Status: DC

## 2019-05-07 MED ORDER — LACTATED RINGERS IV SOLN
INTRAVENOUS | Status: DC
  Administered 2019-05-07: 1000 mL via INTRAVENOUS

## 2019-05-07 MED ORDER — HYDROMORPHONE HCL 1 MG/ML IJ SOLN: 0.2500 mg | INTRAMUSCULAR | Status: DC | PRN

## 2019-05-07 MED ORDER — KETAMINE HCL 50 MG/5ML IJ SOSY
PREFILLED_SYRINGE | INTRAMUSCULAR | Status: AC
  Filled 2019-05-07: qty 5

## 2019-05-07 MED ORDER — PROMETHAZINE HCL 25 MG/ML IJ SOLN: 6.2500 mg | INTRAMUSCULAR | Status: DC | PRN

## 2019-05-07 MED ORDER — PROPOFOL 10 MG/ML IV BOLUS
INTRAVENOUS | Status: DC | PRN
  Administered 2019-05-07: 30 mg via INTRAVENOUS
  Administered 2019-05-07 (×4): 20 mg via INTRAVENOUS

## 2019-05-07 MED ORDER — KETAMINE HCL 10 MG/ML IJ SOLN
INTRAMUSCULAR | Status: DC | PRN
  Administered 2019-05-07: 10 mg via INTRAVENOUS
  Administered 2019-05-07: 5 mg via INTRAVENOUS

## 2019-05-07 MED ORDER — MIDAZOLAM HCL 2 MG/2ML IJ SOLN: 0.5000 mg | Freq: Once | INTRAMUSCULAR | Status: DC | PRN

## 2019-05-07 MED ORDER — PROPOFOL 500 MG/50ML IV EMUL
INTRAVENOUS | Status: DC | PRN
  Administered 2019-05-07: 09:00:00 via INTRAVENOUS
  Administered 2019-05-07: 150 ug/kg/min via INTRAVENOUS

## 2019-05-07 MED ORDER — CHLORHEXIDINE GLUCONATE CLOTH 2 % EX PADS: 6.0000 | MEDICATED_PAD | Freq: Once | CUTANEOUS | Status: DC

## 2019-05-07 MED ORDER — PROPOFOL 10 MG/ML IV BOLUS
INTRAVENOUS | Status: AC
  Filled 2019-05-07: qty 60

## 2019-05-07 MED ORDER — HYDROCODONE-ACETAMINOPHEN 7.5-325 MG PO TABS: 1.0000 | ORAL_TABLET | Freq: Once | ORAL | Status: DC | PRN

## 2019-05-07 NOTE — Anesthesia Preprocedure Evaluation (Signed)
Anesthesia Evaluation  Patient identified by MRN, date of birth, ID band Patient awake    Reviewed: Allergy & Precautions, NPO status , Patient's Chart, lab work & pertinent test results  Airway Mallampati: III  TM Distance: >3 FB Neck ROM: Full    Dental no notable dental hx. (+) Teeth Intact   Pulmonary neg pulmonary ROS, former smoker,    Pulmonary exam normal breath sounds clear to auscultation       Cardiovascular Exercise Tolerance: Good negative cardio ROS Normal cardiovascular examI Rhythm:Regular Rate:Normal  Denies Cardiac history or CP   Neuro/Psych  Headaches, Anxiety Depression negative psych ROS   GI/Hepatic Neg liver ROS, GERD  Medicated and Controlled,  Endo/Other  Hypothyroidism   Renal/GU negative Renal ROS  negative genitourinary   Musculoskeletal negative musculoskeletal ROS (+)   Abdominal   Peds negative pediatric ROS (+)  Hematology negative hematology ROS (+)   Anesthesia Other Findings   Reproductive/Obstetrics negative OB ROS                             Anesthesia Physical Anesthesia Plan  ASA: II  Anesthesia Plan: General   Post-op Pain Management:    Induction: Intravenous  PONV Risk Score and Plan:   Airway Management Planned: Nasal Cannula and Simple Face Mask  Additional Equipment:   Intra-op Plan:   Post-operative Plan:   Informed Consent: I have reviewed the patients History and Physical, chart, labs and discussed the procedure including the risks, benefits and alternatives for the proposed anesthesia with the patient or authorized representative who has indicated his/her understanding and acceptance.     Dental advisory given  Plan Discussed with: CRNA  Anesthesia Plan Comments: (Full PPE use planned  GA, with GETA back up D/W Pt -WTP same )        Anesthesia Quick Evaluation

## 2019-05-07 NOTE — Op Note (Signed)
[facility] Patient Name: [name] [name] Procedure Date: 05/07/2019 9:15 AM MRN: [id number] Date of Birth: 01/16/1966 Attending MD: Jonette Eva MD, MD CSN: [id number] Age: 53 Admit Type: Outpatient Procedure:                Upper GI endoscopy WITH COLD FORCEPS                            BIOPSY/ESOPHAGEAL DILATION Indications:              Generalized abdominal pain, Dysphagia Providers:                Jonette Eva MD, MD, Loma Messing B. Patsy Lager, RN, Dyann Ruddle Referring MD:             Roma Kayser PA, PA Medicines:                Propofol per Anesthesia Complications:            No immediate complications. Estimated Blood Loss:     Estimated blood loss was minimal. Procedure:                Pre-Anesthesia Assessment:                           - Prior to the procedure, a History and Physical                            was performed, and patient medications and                            allergies were reviewed. The patient's tolerance of                            previous anesthesia was also reviewed. The risks                            and benefits of the procedure and the sedation                            options and risks were discussed with the patient.                            All questions were answered, and informed consent                            was obtained. Prior Anticoagulants: The patient has                            taken no previous anticoagulant or antiplatelet                            agents. ASA Grade Assessment: II - A patient with  mild systemic disease. After reviewing the risks                            and benefits, the patient was deemed in                            satisfactory condition to undergo the procedure.                            After obtaining informed consent, the endoscope was                            passed under direct vision. Throughout the   procedure, the patient's blood pressure, pulse, and                            oxygen saturations were monitored continuously. The                            GIF-H190 ([id number]) scope was introduced through the                            mouth, and advanced to the second part of duodenum.                            The upper GI endoscopy was accomplished without                            difficulty. The patient tolerated the procedure                            well. Findings:      No endoscopic abnormality was evident in the esophagus to explain the       patient's complaint of dysphagia. It was decided, however, to proceed       with dilation of the entire esophagus. A guidewire was placed and the       scope was withdrawn. Dilation was performed with a Savary dilator with       mild resistance at 16 mm and 17 mm.      Localized mild inflammation characterized by congestion (edema),       erosions and erythema was found in the gastric antrum. Biopsies(2: BODY,       1: INCISURA, 2: ANTRUM, BTL #3) were taken with a cold forceps for       Helicobacter pylori testing.      The examined duodenum was normal. Biopsies(2: BULB, BTL #1, 4: 2ND       PORTION, BTL #2) for histology were taken with a cold forceps for       evaluation of celiac disease. Impression:               - No endoscopic esophageal abnormality to explain                            patient's dysphagia. Esophagus dilated. Dilated.                           -  MILD Gastritis. Biopsied. Moderate Sedation:      Per Anesthesia Care Recommendation:           - Patient has a contact number available for                            emergencies. The signs and symptoms of potential                            delayed complications were discussed with the                            patient. Return to normal activities tomorrow.                            Written discharge instructions were provided to the                             patient.                           - Low fat diet.                           - Continue present medications.                           - Await pathology results.                           - Return to my office in 4 months. IF DYSPHAGIA                            PERSISTS, PT WILL NEED MANOMETRY/pH/IMPEDANCE. Procedure Code(s):        --- Professional ---                           [phone], Esophagogastroduodenoscopy, flexible,                            transoral; with insertion of guide wire followed by                            passage of dilator(s) through esophagus over guide                            wire                           43239, 59, Esophagogastroduodenoscopy, flexible,                            transoral; with biopsy, single or multiple Diagnosis Code(s):        --- Professional ---                           R13.10, Dysphagia, unspecified  K29.70, Gastritis, unspecified, without bleeding                           R10.84, Generalized abdominal pain CPT copyright 2019 American Medical Association. All rights reserved. The codes documented in this report are preliminary and upon coder review may  be revised to meet current compliance requirements. Jonette Eva, MD Jonette Eva MD, MD 05/07/2019 9:29:29 AM This report has been signed electronically. Number of Addenda: 0

## 2019-05-07 NOTE — Transfer of Care (Signed)
Immediate Anesthesia Transfer of Care Note  Patient: [name] [name]  Procedure(s) Performed: ESOPHAGOGASTRODUODENOSCOPY (EGD) WITH PROPOFOL (N/A ) SAVORY DILATION (N/A ) BIOPSY  Patient Location: PACU  Anesthesia Type:General  Level of Consciousness: awake, alert  and oriented  Airway & Oxygen Therapy: Patient Spontanous Breathing  Post-op Assessment: Report given to RN  Post vital signs: Reviewed and stable  Last Vitals:  Vitals Value Taken Time  BP 97/59 05/07/2019  9:21 AM  Temp    Pulse 69 05/07/2019  9:23 AM  Resp 15 05/07/2019  9:23 AM  SpO2 98 % 05/07/2019  9:23 AM  Vitals shown include unvalidated device data.  Last Pain:  Vitals:   05/07/19 0850  TempSrc:   PainSc: 0-No pain      Patients Stated Pain Goal: 6 (05/07/19 9604)  Complications: No apparent anesthesia complications

## 2019-05-07 NOTE — Anesthesia Postprocedure Evaluation (Signed)
Anesthesia Post Note  Patient: AUREA BUNCE  Procedure(s) Performed: ESOPHAGOGASTRODUODENOSCOPY (EGD) WITH PROPOFOL (N/A ) SAVORY DILATION (N/A ) BIOPSY  Patient location during evaluation: PACU Anesthesia Type: General Level of consciousness: awake and alert and oriented Pain management: pain level controlled Vital Signs Assessment: post-procedure vital signs reviewed and stable Respiratory status: spontaneous breathing Cardiovascular status: blood pressure returned to baseline Postop Assessment: no apparent nausea or vomiting Anesthetic complications: no     Last Vitals:  Vitals:   05/07/19 0837 05/07/19 0921  BP: 126/80 (!) 97/59  Pulse: 71 78  Resp: 20 11  Temp: 36.8 C (P) 36.5 C  SpO2: 100% 100%    Last Pain:  Vitals:   05/07/19 0850  TempSrc:   PainSc: 0-No pain                 Lamia Mariner

## 2019-05-07 NOTE — H&P (Signed)
Primary Care Physician:  Sheela Stack Primary Gastroenterologist:  Dr. Darrick Penna  Pre-Procedure History & Physical: HPI:  [name] [name] is a 53 y.o. female here for [facility].  Past Medical History:  Diagnosis Date  . Anxiety   . Back pain   . Breast mass   . Common migraine with intractable migraine 08/24/2015  . Depression   . Dysphagia   . GERD (gastroesophageal reflux disease)   . Headache   . Hypothyroid   . Neurofibromatosis (HCC)   . 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 Vertigo     Past Surgical History:  Procedure Laterality Date  . BLADDER SURGERY    . BREAST LUMPECTOMY    . CHOLECYSTECTOMY    . COLONOSCOPY  2019  . TUBAL LIGATION      Prior to Admission medications   Medication Sig Start Date End Date Taking? Authorizing Provider  acetaminophen (TYLENOL) 500 MG tablet Take 500 mg by mouth every 6 (six) hours as needed (pain).    Yes [provider]  escitalopram (LEXAPRO) 10 MG tablet Take 10 mg by mouth daily.    Yes [provider]  levothyroxine (SYNTHROID) 75 MCG tablet Take 75 mcg by mouth daily before breakfast.  03/15/16  Yes [provider]  lubiprostone (AMITIZA) 24 MCG capsule Take 1 capsule (24 mcg total) by mouth 2 (two) times daily with a meal. 04/08/19  Yes Gelene Mink, NP  omeprazole (PRILOSEC) 20 MG capsule Take 20 mg by mouth daily.   Yes [provider]    Allergies as of 04/17/2019  . (No Known Allergies)    Family History  Problem Relation Age of Onset  . Breast cancer Mother   . Hyperlipidemia Father   . Hypertension Father   . CAD Father   . Breast cancer Sister   . CAD Brother   . Breast cancer Paternal Aunt   . Neurofibromatosis Neg Hx   . Colon cancer Neg Hx     Social History   Socioeconomic History  . Marital status: Married    Spouse name: Not on file  . Number of children: 2  . Years of education: 3  . Highest education level: Not on file   Occupational History  . Occupation: unemployed  Social Needs  . Financial resource strain: Not on file  . Food insecurity:    Worry: Not on file    Inability: Not on file  . Transportation needs:    Medical: Not on file    Non-medical: Not on file  Tobacco Use  . Smoking status: Former Smoker    Last attempt to quit: 12/12/1988    Years since quitting: 30.4  . Smokeless tobacco: Never Used  Substance and Sexual Activity  . Alcohol use: No  . Drug use: No  . Sexual activity: Not on file  Lifestyle  . Physical activity:    Days per week: Not on file    Minutes per session: Not on file  . Stress: Not on file  Relationships  . Social connections:    Talks on phone: Not on file    Gets together: Not on file    Attends religious service: Not on file    Active member of club or organization: Not on file    Attends meetings of clubs or organizations: Not on file    Relationship status: Not on file  . Intimate partner violence:    Fear of current or ex partner: Not on  file    Emotionally abused: Not on file    Physically abused: Not on file    Forced sexual activity: Not on file  Other Topics Concern  . Not on file  Social History Narrative   Patient drinks 2 cups of caffeine daily.   Patient is right handed.    Review of Systems: See HPI, otherwise negative ROS   Physical Exam: BP 126/80   Pulse 71   Temp 98.3 F (36.8 C) (Oral)   Resp 20   Ht 5\' 6"  (1.676 m)   Wt 54.4 kg   SpO2 100%   BMI 19.37 kg/m  General:   Alert,  pleasant and cooperative in NAD Head:  Normocephalic and atraumatic. Neck:  Supple; Lungs:  Clear throughout to auscultation.    Heart:  Regular rate and rhythm. Abdomen:  Soft, nontender and nondistended. Normal bowel sounds, without guarding, and without rebound.   Neurologic:  Alert and  oriented x4;  grossly normal neurologically.  Impression/Plan:    DYSPHAGIA/dyspepsia  PLAN:  EGD/possible DIL TODAY.  DISCUSSED PROCEDURE,  BENEFITS, & RISKS: < 1% chance of medication reaction, bleeding, perforation, or ASPIRATION.

## 2019-05-07 NOTE — Discharge Instructions (Signed)
I dilated your esophagus. I DID NOT SEE A DEFINITE NARROWING IN YOUR esophagus. You have MILD gastritis AND DUODENITIS. I biopsied your E SMALL BOWEL AND stomach.   DRINK WATER TO KEEP YOUR URINE LIGHT YELLOW.  FOLLOW A LOW FAT DIET. MEATS SHOULD BE BAKED, BROILED, OR BOILED. AVOID FRIED FOODS.  AVOID REFLUX TRIGGERS. SEE INFO BELOW.  DO NOT EAT FAST FOOD OR DRINK CAFFEINATED BEVERAGES.   CONTINUE OMEPRAZOLE. TAKE 30 MINUTES PRIOR TO MEALS ONCE DAILY.  USE PEPCID OR TAGAMET FOR ADDITIONAL RELIEF FROM HEARTBURN OR UPPER ABDOMINAL PAIN.   YOUR BIOPSY RESULTS WILL BE BACK IN 5 BUSINESS DAYS.  FOLLOW UP IN 4 MOS E30 DYSPHAGIA/DYSPEPSIA.  UPPER ENDOSCOPY AFTER CARE Read the instructions outlined below and refer to this sheet in the next week. These discharge instructions provide you with general information on caring for yourself after you leave the hospital. While your treatment has been planned according to the most current medical practices available, unavoidable complications occasionally occur. If you have any problems or questions after discharge, call DR. Virl Coble, [phone].  ACTIVITY  You may resume your regular activity, but move at a slower pace for the next 24 hours.   Take frequent rest periods for the next 24 hours.   Walking will help get rid of the air and reduce the bloated feeling in your belly (abdomen).   No driving for 24 hours (because of the medicine (anesthesia) used during the test).   You may shower.   Do not sign any important legal documents or operate any machinery for 24 hours (because of the anesthesia used during the test).    NUTRITION  Drink plenty of fluids.   You may resume your normal diet as instructed by your doctor.   Begin with a light meal and progress to your normal diet. Heavy or fried foods are harder to digest and may make you feel sick to your stomach (nauseated).   Avoid alcoholic beverages for 24 hours or as instructed.     MEDICATIONS  You may resume your normal medications.   WHAT YOU CAN EXPECT TODAY  Some feelings of bloating in the abdomen.   Passage of more gas than usual.    IF YOU HAD A BIOPSY TAKEN DURING THE UPPER ENDOSCOPY:  Eat a soft diet IF YOU HAVE NAUSEA, BLOATING, ABDOMINAL PAIN, OR VOMITING.    FINDING OUT THE RESULTS OF YOUR TEST Not all test results are available during your visit. DR. Darrick PennaFIELDS WILL CALL YOU WITHIN 14 DAYS OF YOUR PROCEDUE WITH YOUR RESULTS. Do not assume everything is normal if you have not heard from DR. Gayland Nicol, CALL HER OFFICE AT [phone].  SEEK IMMEDIATE MEDICAL ATTENTION AND CALL THE OFFICE: [phone] IF:  You have more than a spotting of blood in your stool.   Your belly is swollen (abdominal distention).   You are nauseated or vomiting.   You have a temperature over 101F.   You have abdominal pain or discomfort that is severe or gets worse throughout the day.  DYSPHAGIA DYSPHAGIA can be caused by stomach acid backing up into the tube that carries food from the mouth down to the stomach (lower esophagus).  TREATMENT There are a number of medicines used to treat DYSPHAGIA including: Antacids.  Proton-pump inhibitors: PROTONIX TAGAMET/PEPCID    Lifestyle and home remedies to control REFLUX and HEARTBURN  You may eliminate or reduce the frequency of heartburn by making the following lifestyle changes:   Control your weight. Being overweight  is a major risk factor for heartburn and GERD. Excess pounds put pressure on your abdomen, pushing up your stomach and causing acid to back up into your esophagus.    Eat smaller meals. 4 TO 6 MEALS A DAY. This reduces pressure on the lower esophageal sphincter, helping to prevent the valve from opening and acid from washing back into your esophagus.    Loosen your belt. Clothes that fit tightly around your waist put pressure on your abdomen and the lower esophageal sphincter.     Eliminate  heartburn triggers. Everyone has specific triggers. Common triggers such as fatty or fried foods, spicy food, tomato sauce, carbonated beverages, alcohol, chocolate, mint, garlic, onion, caffeine and nicotine may make heartburn worse.    Avoid stooping or bending. Tying your shoes is OK. Bending over for longer periods to weed your garden isn't, especially soon after eating.    Don't lie down after a meal. Wait at least three to four hours after eating before going to bed, and don't lie down right after eating.   Alternative medicine  Several home remedies exist for treating GERD, but they provide only temporary relief. They include drinking baking soda (sodium bicarbonate) added to water or drinking other fluids such as baking soda mixed with cream of tartar and water.  Although these liquids create temporary relief by neutralizing, washing away or buffering acids, eventually they aggravate the situation by adding gas and fluid to your stomach, increasing pressure and causing more acid reflux. Further, adding more sodium to your diet may increase your blood pressure and add stress to your heart, and excessive bicarbonate ingestion can alter the acid-base balance in your body.  Gastritis/DUODENITIS  Gastritis is an inflammation (the body's way of reacting to injury and/or infection) of the stomach. DUODENITIS is an inflammation (the body's way of reacting to injury and/or infection) of the FIRST PART OF THE SMALL INTESTINES. It is often caused by bacterial (germ) infections. It can also be caused BY ASPIRIN, BC/GOODY POWDER'S, (IBUPROFEN) MOTRIN, OR ALEVE (NAPROXEN), chemicals (including alcohol), SPICY FOODS, and medications. This illness may be associated with generalized malaise (feeling tired, not well), UPPER ABDOMINAL STOMACH cramps, and fever. One common bacterial cause of gastritis is an organism known as H. Pylori. This can be treated with antibiotics.

## 2019-05-08 ENCOUNTER — Telehealth: Payer: Self-pay | Admitting: Gastroenterology

## 2019-05-08 ENCOUNTER — Encounter (HOSPITAL_COMMUNITY): Payer: Self-pay | Admitting: Gastroenterology

## 2019-05-08 NOTE — Telephone Encounter (Signed)
PT is aware.

## 2019-05-08 NOTE — Telephone Encounter (Signed)
Please call pt. HER BIOPSIES shows  Gastritis AND DUODNITIS.   DRINK WATER TO KEEP YOUR URINE LIGHT YELLOW.  FOLLOW A LOW FAT DIET. MEATS SHOULD BE BAKED, BROILED, OR BOILED. AVOID FRIED FOODS.  AVOID REFLUX TRIGGERS.   DO NOT EAT FAST FOOD OR DRINK CAFFEINATED BEVERAGES.   CONTINUE OMEPRAZOLE. TAKE 30 MINUTES PRIOR TO MEALS ONCE DAILY.  USE PEPCID OR TAGAMET FOR ADDITIONAL RELIEF FROM HEARTBURN OR UPPER ABDOMINAL PAIN.  FOLLOW UP IN 4 MOS E30 DYSPHAGIA/DYSPEPSIA.

## 2019-05-09 NOTE — Telephone Encounter (Signed)
PATIENT SCHEDULED  °

## 2019-05-13 ENCOUNTER — Telehealth: Payer: Self-pay

## 2019-05-13 NOTE — Telephone Encounter (Addendum)
I had Vm from pt on 05/09/2019 at 2:29 pm. ( We closed office at noon on that day). She said the hospital had called to check on her post procedure and she told them she was not feeling well. They told her to call Dr. Darrick Penna office and let her know. I returned her call this morning and left Vm for her to call and let us know what is going on.   Pt returned call and said she never did hear if her corona test was negative. I told her she would not have had her procedure if it had been positive.  She is still having stomach problems and her throat has hurt some since procedure.  She has not had a fever, but stomach hurts all the way across in the middle and also RLQ and LLQ pain.  She is taking the Amitiza and it seems to make stomach worse. It has been causing diarrhea also.   She said this has been going on since she started the Amitiza.  She saw Lewie Loron, NP in the office.   I am sending to Dr. Darrick Penna in Anna's absence today.

## 2019-05-14 NOTE — Telephone Encounter (Signed)
Please check to see how patient is doing. She had previously been on Linzess, but she did not have ideal results. I changed to Amitiza 24 BID. We can decrease Amitiza to just once a day, or she can try the 8 mcg samples BID. How is swallowing? Make sure taking PPI daily.

## 2019-05-14 NOTE — Telephone Encounter (Signed)
Forwarding to Lewie Loron, NP also.

## 2019-05-14 NOTE — Telephone Encounter (Signed)
LMOM to call.

## 2019-05-15 ENCOUNTER — Telehealth: Payer: Self-pay | Admitting: Gastroenterology

## 2019-05-15 MED ORDER — LUBIPROSTONE 8 MCG PO CAPS: 8.0000 ug | ORAL_CAPSULE | Freq: Two times a day (BID) | ORAL | 11 refills | Status: DC

## 2019-05-15 NOTE — Telephone Encounter (Signed)
See previous note

## 2019-05-15 NOTE — Telephone Encounter (Signed)
THINKS AMITIZA. IS CAUSING ABDOMINAL PAIN. THROAT HURTS A LITTLE AND SWALLOWING. STILL EATING SLOWLY. MAYBE FEELS LIKE SWALLOWING A LITTLE BIT. PAIN WITH SWALLOWING. WOULD LIKE A RX FOR AMITIZA SENT TO PHARMACY.  EXPLAINED THAT PAIN/SWALLOWING SHOULD BE BETTER OVER THE NEXT 2-3 WEEKS. PT DOES NOT HAVE MY CHART OF A COMPUTER. SEND SOFT MECHANICAL DIET HANDOUT. CONFIRMED CORRECT ADDESS IN [facility].

## 2019-05-15 NOTE — Telephone Encounter (Signed)
Patient returned called , please call back

## 2019-05-15 NOTE — Telephone Encounter (Signed)
Noted, soft mechanical diet sheet mailed to pt.

## 2019-05-23 IMAGING — CT CT ABDOMEN AND PELVIS WITH CONTRAST
3 of 5 series · 17 of 46 positions shown, 19 images · IV contrast (Isovue)
Comparison: None.

CLINICAL DATA: Pelvic pain, constipation.

EXAM:
CT ABDOMEN AND PELVIS WITH CONTRAST
TECHNIQUE: Multidetector CT imaging of the abdomen and pelvis was performed
using the standard protocol following bolus administration of
intravenous contrast.
CONTRAST:  100mL OMNIPAQUE IOHEXOL 300 MG/ML  SOLN

[Series 2: axial st · axial · 0.66mm/px · z∈[-300,+70]mm · 12 of 88 slices shown, 14 images]
[im 7/88  soft-tissue]
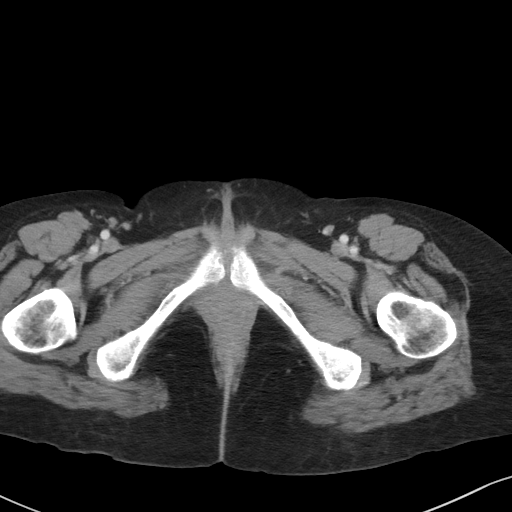
[im 7/88  bone]
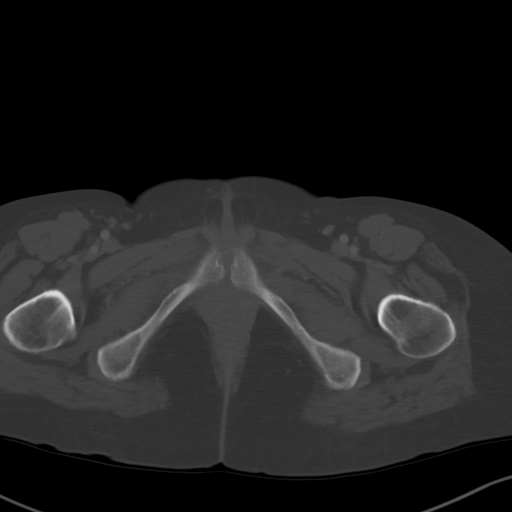
[im 14/88  soft-tissue]
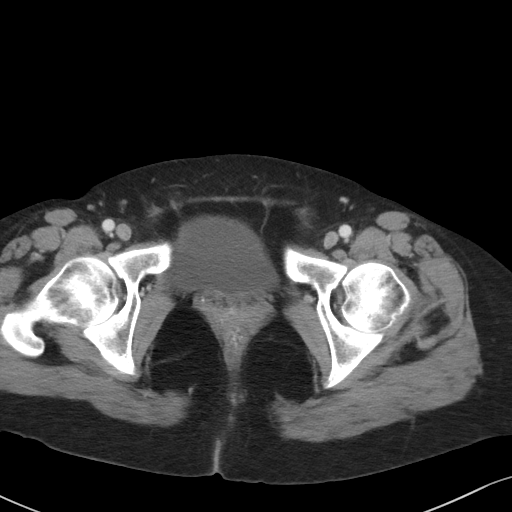
[im 21/88  soft-tissue]
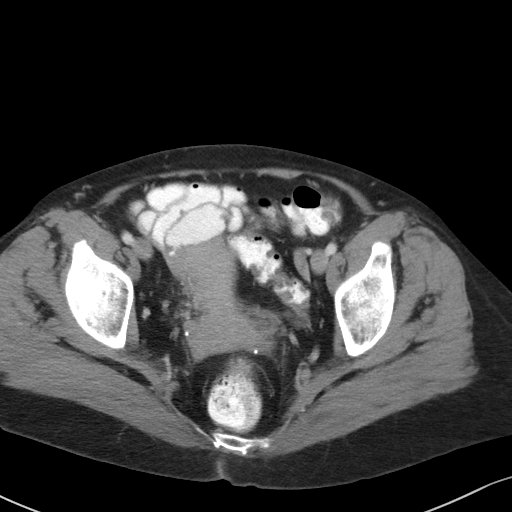
[im 27/88  soft-tissue]
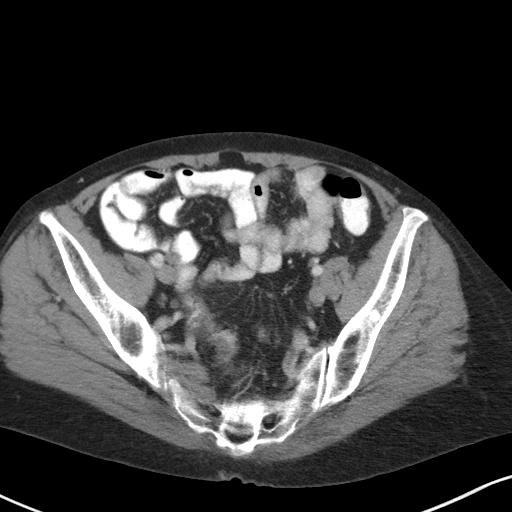
[im 34/88  soft-tissue]
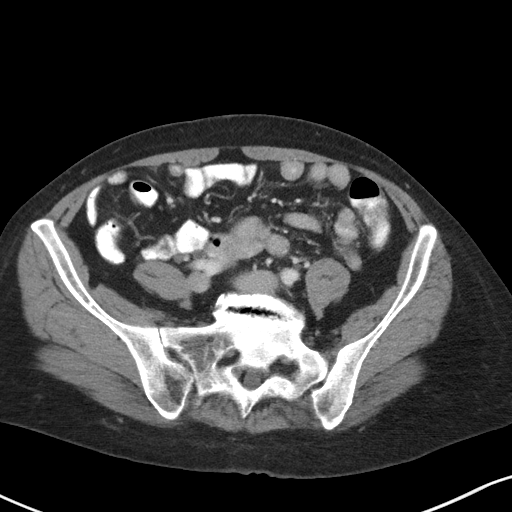
[im 41/88  soft-tissue]
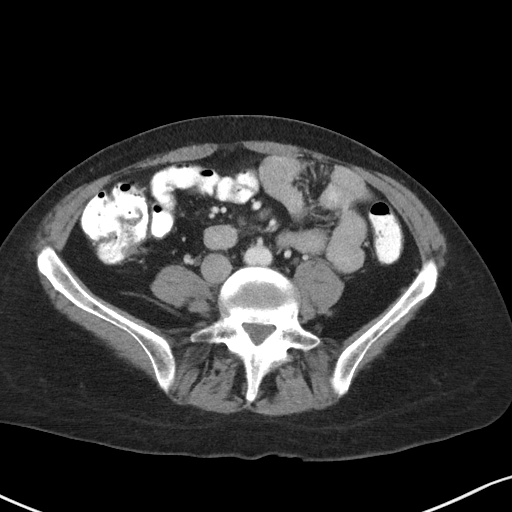
[im 47/88  soft-tissue]
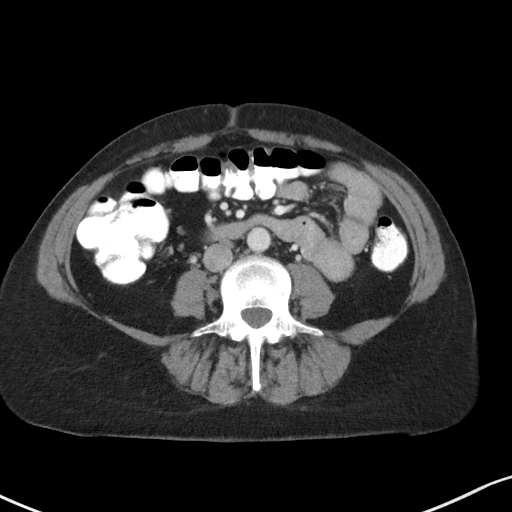
[im 54/88  soft-tissue]
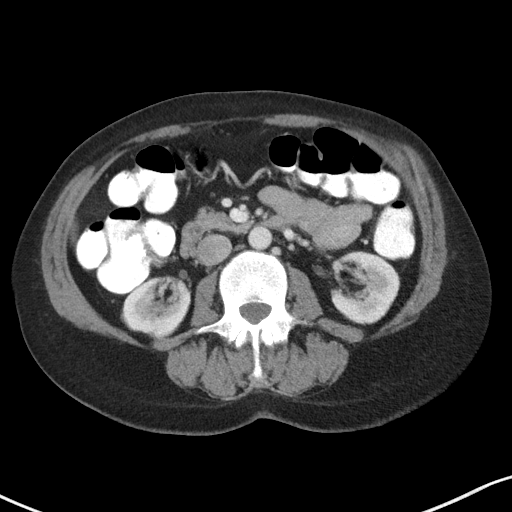
[im 61/88  soft-tissue]
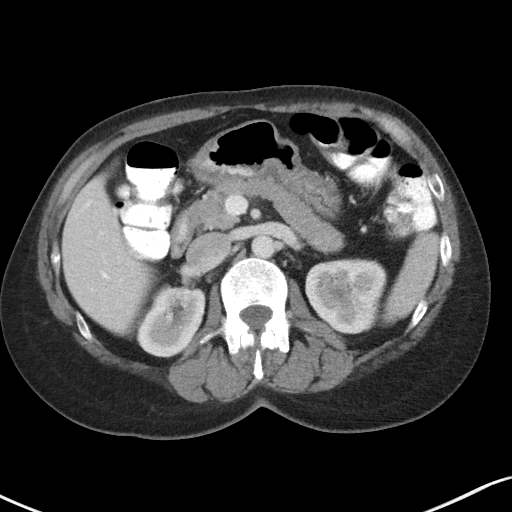
[im 61/88  bone]
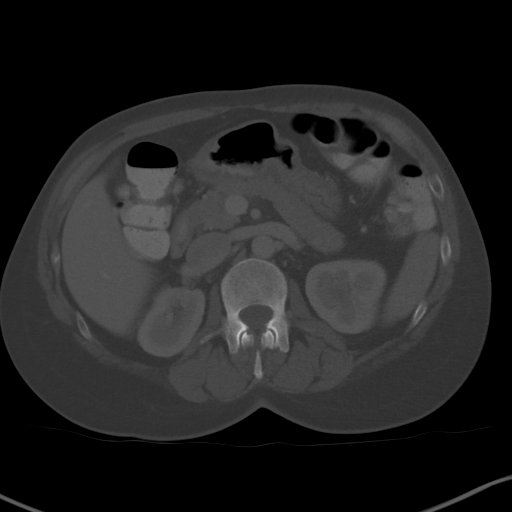
[im 67/88  soft-tissue]
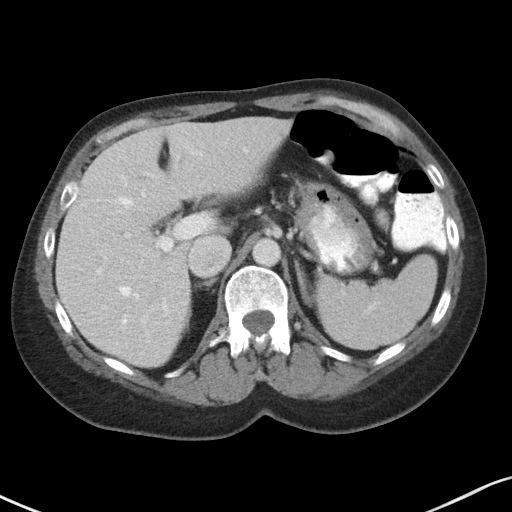
[im 74/88  soft-tissue]
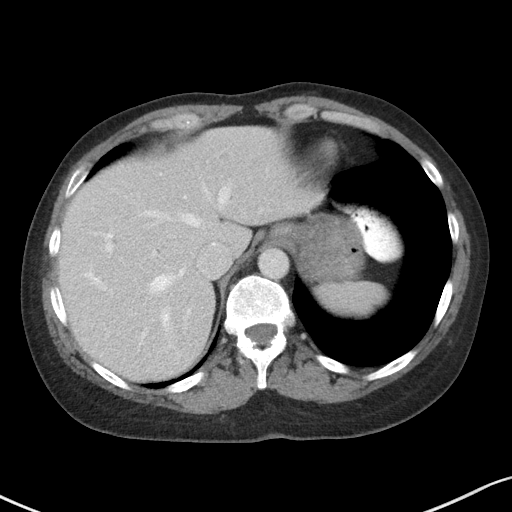
[im 81/88  soft-tissue]
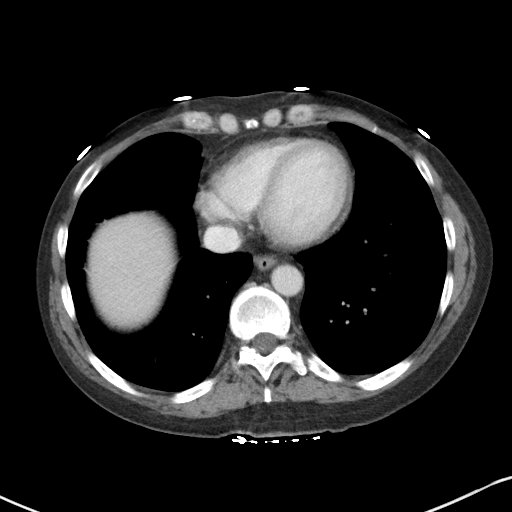

[Series 4: lung bases · axial · 0.66mm/px · z∈[-31,-17]mm · 2 of 75 slices shown]
[im 7/75  bone]
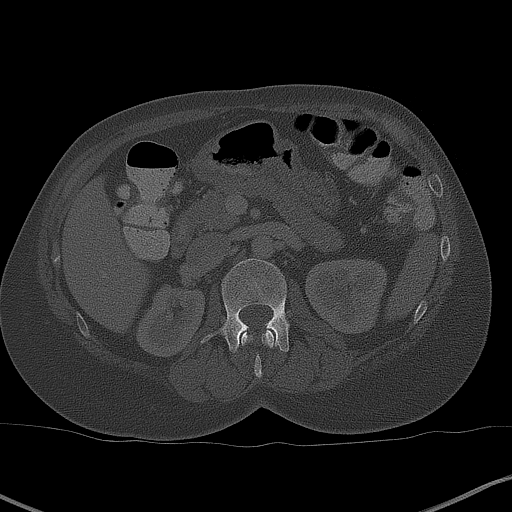
[im 14/75  bone]
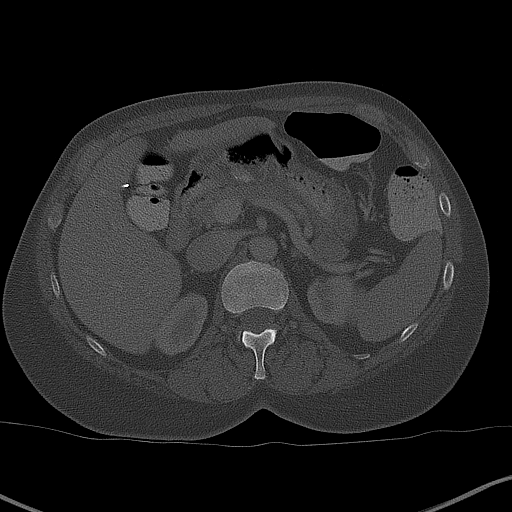

[Series 6: coronal st · coronal · 0.76mm/px · 3 of 86 slices shown]
[im 29/86  soft-tissue]
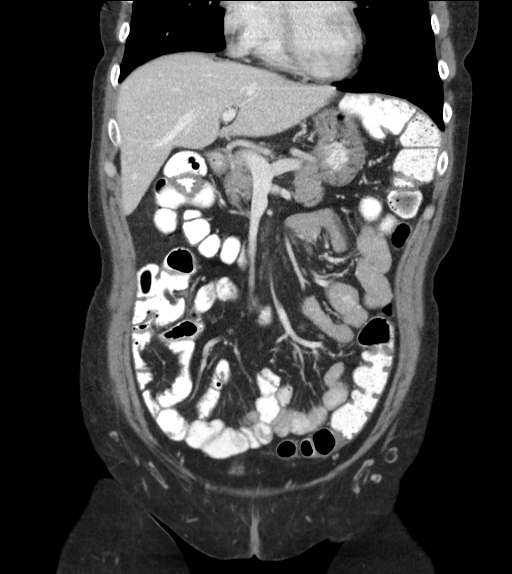
[im 38/86  soft-tissue]
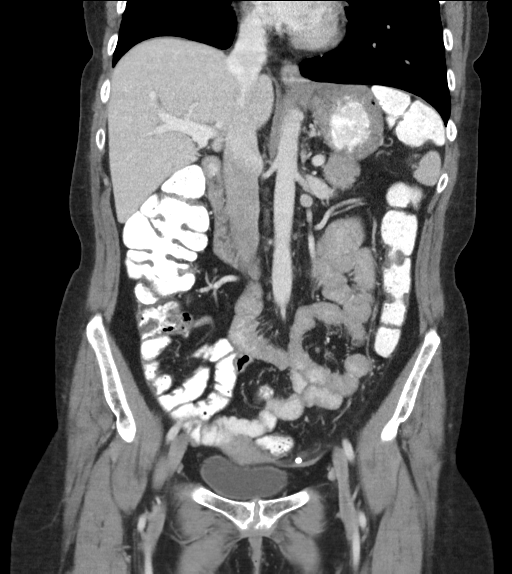
[im 48/86  soft-tissue]
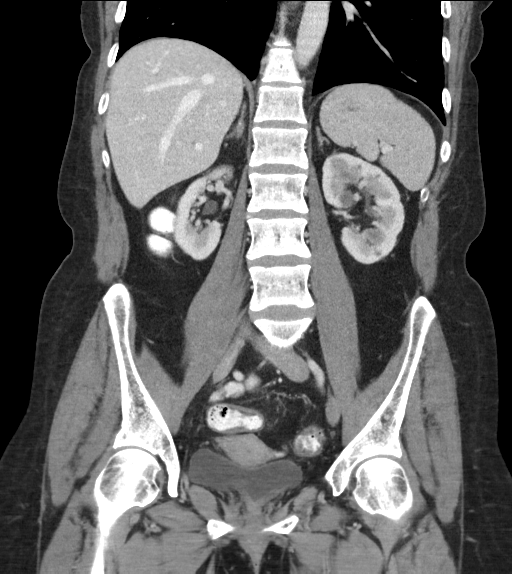

[17 of 46 positions shown; findings below may reference images not displayed]

FINDINGS: Lower chest: No acute abnormality.

Hepatobiliary: No focal liver abnormality is seen. Status post
cholecystectomy. No biliary dilatation.

Pancreas: Unremarkable. No pancreatic ductal dilatation or
surrounding inflammatory changes.

Spleen: Normal in size without focal abnormality.

Adrenals/Urinary Tract: Adrenal glands appear normal. Bilateral
renal cysts are noted. No hydronephrosis or renal obstruction is
noted. Urinary bladder is unremarkable. No renal or ureteral calculi
are noted.

Stomach/Bowel: Stomach is within normal limits. Appendix appears
normal. No evidence of bowel wall thickening, distention, or
inflammatory changes.

Vascular/Lymphatic: No significant vascular findings are present. No
enlarged abdominal or pelvic lymph nodes.

Reproductive: Uterus and bilateral adnexa are unremarkable.

Other: No abdominal wall hernia or abnormality. No abdominopelvic
ascites.

Musculoskeletal: No acute or significant osseous findings.
IMPRESSION: No acute abnormality seen in the abdomen or pelvis.

## 2019-08-02 ENCOUNTER — Other Ambulatory Visit: Payer: Self-pay | Admitting: Gastroenterology

## 2019-09-09 ENCOUNTER — Ambulatory Visit: Payer: Medicaid Other | Admitting: Gastroenterology

## 2019-09-09 ENCOUNTER — Other Ambulatory Visit: Payer: Self-pay

## 2019-09-09 ENCOUNTER — Encounter: Payer: Self-pay | Admitting: Gastroenterology

## 2019-09-09 VITALS — BP 114/74 | HR 66 | Temp 97.1°F | Ht 66.0 in | Wt 148.6 lb

## 2019-09-09 DIAGNOSIS — R1030 Lower abdominal pain, unspecified: Secondary | ICD-10-CM

## 2019-09-09 DIAGNOSIS — K5900 Constipation, unspecified: Secondary | ICD-10-CM

## 2019-09-09 DIAGNOSIS — R1319 Other dysphagia: Secondary | ICD-10-CM

## 2019-09-09 DIAGNOSIS — R1310 Dysphagia, unspecified: Secondary | ICD-10-CM

## 2019-09-09 MED ORDER — POLYETHYLENE GLYCOL 3350 17 GM/SCOOP PO POWD: ORAL | 5 refills | Status: DC

## 2019-09-09 NOTE — Assessment & Plan Note (Signed)
Chronic lower abdominal pain and epigastric pain, work-up has included colonoscopy/EGD/CT.  Continue to work towards better improvement of constipation to see if any improvement in her symptoms.  Obtain labs to further evaluate abdominal pain.  She has a history of neurofibromatosis type I, at increased risk of GIST (especially in small intestines). CT A/P with contrast unremarkable. May need further imaging if labs unrevealing and abdominal pain persists.

## 2019-09-09 NOTE — Assessment & Plan Note (Signed)
Poorly controlled.  Stool frequency improved but consistency remains hard.  Continue Amitiza 8 mcg twice daily.  Add MiraLAX 17 g daily.  Call with ongoing problems.

## 2019-09-09 NOTE — Progress Notes (Signed)
Primary Care Physician: Royann Shivers, PA-C  Primary Gastroenterologist:  Jonette Eva, MD   Chief Complaint  Patient presents with  . Abdominal Pain    lower abd, constant stabbing pain, has not improved since last visit  . Constipation    can go a day without BM, amitiza helps some    HPI: [name] [name] is a 53 y.o. female here for follow-up.  Seen in April during virtual visit.  Patient was plain of chronic lower abdominal pain, worse postprandially, no improvement with Linzess 290 mcg daily.  Constipation not ideally managed.  She was switched to Amitiza 24 mcg twice daily. She was also having dysphagia, new onset.  Typical GERD well controlled omeprazole.  CT abdomen pelvis with contrast April 2020 for abdominal pain, unremarkable.  EGD with esophageal dilation back in May.  Esophagus appeared normal.  She had mild gastritis/peptic duodenitis.  Small bowel biopsies negative for that disease.  Gastric biopsies negative for H. pylori.  Colonoscopy April 2019 by Dr. Marcha Solders, prep excellent, normal study next colonoscopy 10 years.  Since her last office visit according to our records she called in with complaints of diarrhea and may be worse abdominal pain so she was switched Amitiza to 8 mcg twice daily.  Patient is a poor historian.  She has difficulty recalling details.  Does not remember why her Amitiza was switched.  Today she is reporting constant lower abdominal pain and epigastric pain. Pain is always there, for more than a year. crampy in quality. Like a period. Uses a pillow for pressure and lays down. Pain gets worse with a BM.  No relief in pain with bowel movement.  States her pain is currently at a 8 out of 10.  Appears comfortable in the office visit today.  BM every other day. Does not feel like they are very ineffective. [place] 1. Has to sit there for along time. No melena, brbpr. Appetite ok. Everything eat gets pain in the center of stomach. No N/V. No  heartburn.  Still having dysphagia.  No improvement with esophageal dilation.  Has to wash meds and food down.   She saw her gynecologist back in June for pelvic pain.  Ultrasound unremarkable.   Current Outpatient Medications  Medication Sig Dispense Refill  . acetaminophen (TYLENOL) 500 MG tablet Take 500 mg by mouth every 6 (six) hours as needed (pain).     Marland Kitchen escitalopram (LEXAPRO) 10 MG tablet Take 10 mg by mouth daily.     Marland Kitchen levothyroxine (SYNTHROID) 75 MCG tablet Take 75 mcg by mouth daily before breakfast.   1  . lubiprostone (AMITIZA) 8 MCG capsule Take 1 capsule (8 mcg total) by mouth 2 (two) times daily with a meal. 60 capsule 11  . omeprazole (PRILOSEC) 20 MG capsule Take 20 mg by mouth daily.     No current facility-administered medications for this visit.     Allergies as of 09/09/2019  . (No Known Allergies)    ROS:  General: Negative for anorexia, weight loss, fever, chills, fatigue, weakness. ENT: Negative for hoarseness, difficulty swallowing , nasal congestion. CV: Negative for chest pain, angina, palpitations, dyspnea on exertion, peripheral edema.  Respiratory: Negative for dyspnea at rest, dyspnea on exertion, cough, sputum, wheezing.  GI: See history of present illness. GU:  Negative for dysuria, hematuria, urinary incontinence, urinary frequency, nocturnal urination.  Endo: Negative for unusual weight change.    Physical Examination:   BP 114/74   Pulse 66  Temp (!) 97.1 F (36.2 C) (Oral)   Ht 5\' 6"  (1.676 m)   Wt 148 lb 9.6 oz (67.4 kg)   BMI 23.98 kg/m   General: Well-nourished, well-developed in no acute distress.  Eyes: No icterus. Mouth: Oropharyngeal mucosa moist and pink , no lesions erythema or exudate. Lungs: Clear to auscultation bilaterally.  Heart: Regular rate and rhythm, no murmurs rubs or gallops.  Abdomen: Bowel sounds are normal, nontender, nondistended, no hepatosplenomegaly or masses, no abdominal bruits or hernia , no rebound  or guarding.  Patient reports current abdominal pain 8 out of 10, 10 being the worst pain ever.  She appears very comfortable on exam. Extremities: No lower extremity edema. No clubbing or deformities. Neuro: Alert and oriented x 4   Skin: Warm and dry, no jaundice.   Psych: Alert and cooperative, normal mood and affect.   Imaging Studies: No results found.

## 2019-09-09 NOTE — Assessment & Plan Note (Signed)
Normal-appearing esophagus on recent EGD.  No improvement of dysphagia with dilation.  Per Dr. Oneida Alar recommendations, consider manometry/pH/impedance study for further evaluation.  Patient is interested.  We will work on ordering this in the near future once we obtain labs to evaluate her abdominal pain.

## 2019-09-09 NOTE — Patient Instructions (Signed)
1. Please have labs done. Call 5 business days after you have labs if you have not heard from [place] about results.  2. Continue Amitiza 65mcg twice daily with food for constipation.  3. Add Miralax one capful (17 grams) once daily for constipation. I sent in RX but sometimes insurance requires you to buy over the counter. Check with your pharmacist.  4. Once labs are back we will contact you with further recommendations. Will also discuss sending you for further swallowing tests at that time.

## 2023-01-24 ENCOUNTER — Encounter: Payer: Self-pay | Admitting: Neurology

## 2023-01-24 ENCOUNTER — Ambulatory Visit: Payer: Medicaid Other | Admitting: Neurology

## 2023-01-24 VITALS — BP 117/78 | HR 74 | Ht 66.0 in | Wt 126.4 lb

## 2023-01-24 DIAGNOSIS — Q8501 Neurofibromatosis, type 1: Secondary | ICD-10-CM

## 2023-01-24 DIAGNOSIS — R51 Headache with orthostatic component, not elsewhere classified: Secondary | ICD-10-CM | POA: Diagnosis not present

## 2023-01-24 DIAGNOSIS — G43709 Chronic migraine without aura, not intractable, without status migrainosus: Secondary | ICD-10-CM

## 2023-01-24 DIAGNOSIS — R292 Abnormal reflex: Secondary | ICD-10-CM

## 2023-01-24 DIAGNOSIS — R258 Other abnormal involuntary movements: Secondary | ICD-10-CM

## 2023-01-24 DIAGNOSIS — R519 Headache, unspecified: Secondary | ICD-10-CM

## 2023-01-24 DIAGNOSIS — G1229 Other motor neuron disease: Secondary | ICD-10-CM

## 2023-01-24 MED ORDER — NURTEC 75 MG PO TBDP: 75.0000 mg | ORAL_TABLET | Freq: Every day | ORAL | 0 refills | Status: DC | PRN

## 2023-01-24 MED ORDER — AJOVY 225 MG/1.5ML ~~LOC~~ SOAJ: 225.0000 mg | SUBCUTANEOUS | 11 refills | Status: DC

## 2023-01-24 MED ORDER — FREMANEZUMAB-VFRM 225 MG/1.5ML ~~LOC~~ SOSY: 225.0000 mg | PREFILLED_SYRINGE | Freq: Once | SUBCUTANEOUS | Status: DC

## 2023-01-24 NOTE — Patient Instructions (Addendum)
- MRI of the brain w/wo contrast - Preventative: Start Ajovy monthly for migraines (may need to change to [facility]) - take on the 1st of every month - Emergency: RIGHT at onset of headache take nurtec once daily as needed  Fremanezumab Injection What is this medication? FREMANEZUMAB (fre ma NEZ ue mab) prevents migraines. It works by blocking a substance in the body that causes migraines. It is a monoclonal antibody. This medicine may be used for other purposes; ask your health care provider or pharmacist if you have questions. COMMON BRAND NAME(S): AJOVY What should I tell my care team before I take this medication? They need to know if you have any of these conditions: An unusual or allergic reaction to fremanezumab, other medications, foods, dyes, or preservatives Pregnant or trying to get pregnant Breast-feeding How should I use this medication? This medication is injected under the skin. You will be taught how to prepare and give it. Take it as directed on the prescription label. Keep taking it unless your care team tells you to stop. It is important that you put your used needles and syringes in a special sharps container. Do not put them in a trash can. If you do not have a sharps container, call your pharmacist or care team to get one. Talk to your care team about the use of this medication in children. Special care may be needed. Overdosage: If you think you have taken too much of this medicine contact a poison control center or emergency room at once. NOTE: This medicine is only for you. Do not share this medicine with others. What if I miss a dose? If you miss a dose, take it as soon as you can. If it is almost time for your next dose, take only that dose. Do not take double or extra doses. What may interact with this medication? Interactions are not expected. This list may not describe all possible interactions. Give your health care provider a list of all the medicines, herbs,  non-prescription drugs, or dietary supplements you use. Also tell them if you smoke, drink alcohol, or use illegal drugs. Some items may interact with your medicine. What should I watch for while using this medication? Tell your care team if your symptoms do not start to get better or if they get worse. What side effects may I notice from receiving this medication? Side effects that you should report to your care team as soon as possible: Allergic reactions or angioedema--skin rash, itching or hives, swelling of the face, eyes, lips, tongue, arms, or legs, trouble swallowing or breathing Side effects that usually do not require medical attention (report to your care team if they continue or are bothersome): Pain, redness, or irritation at injection site This list may not describe all possible side effects. Call your doctor for medical advice about side effects. You may report side effects to FDA at 1-800-FDA-1088. Where should I keep my medication? Keep out of the reach of children and pets. Store in a refrigerator or at room temperature between 20 and 25 degrees C (68 and 77 degrees F). Refrigeration (preferred): Store in the refrigerator. Do not freeze. Keep in the original container until you are ready to take it. Remove the dose from the carton about 30 minutes before it is time for you to use it. If the dose is not used, it may be stored in the original container at room temperature for 7 days. Get rid of any unused medication after the expiration  date. Room Temperature: This medication may be stored at room temperature for up to 7 days. Keep it in the original container. Protect from light until time of use. If it is stored at room temperature, get rid of any unused medication after 7 days or after it expires, whichever is first. To get rid of medications that are no longer needed or have expired: Take the medication to a medication take-back program. Check with your pharmacy or law enforcement  to find a location. If you cannot return the medication, ask your pharmacist or care team how to get rid of this medication safely. NOTE: This sheet is a summary. It may not cover all possible information. If you have questions about this medicine, talk to your doctor, pharmacist, or health care provider.  2023 Elsevier/Gold Standard (2017-08-29 00:00:00)   Neurofibromatosis, Adult Neurofibromatosis is a rare genetic disorder that causes skin and bone changes and the development of nerve tumors. The tumors vary depending on the type of neurofibromatosis you have, but they are usually not cancerous (are usually benign). There are three types of neurofibromatosis: Neurofibromatosis 1 (NF1). This is the most common type. Neurofibromatosis 2 (NF2). Schwannomatosis. This is the least common type. In most cases, this condition gets worse over time. What are the causes? This condition is caused by a change, or mutation, in genes that control the growth of nerve cells. You may have neurofibromatosis: At birth. This means that mutated genes were passed from parent to child (inherited). Up to half of all cases of NF1 and NF2 are inherited. After birth. This means that your genes mutated after you were born. It is not known why this happens. This is the most common cause of schwannomatosis. What are the signs or symptoms? Symptoms of this condition depend on the type of neurofibromatosis you have.  Signs and symptoms of NF1 usually begin in childhood. They may include: Coffee-colored skin spots (cafe au lait spots). Freckles in the groin or under the arms. Benign tumors (neurofibromas) attached to one or more nerves. Discolored specks in the colored part of the eye (iris). Curving of the spine or other bone deformities. Short stature. Headaches. Seizures. Learning disabilities. Signs and symptoms of NF2 usually begin in early adulthood. They may include: Benign tumors that develop on a nerve  inside the ear (schwannoma). Ringing in the ears. Sudden hearing loss or hearing loss that gets worse over time. Balance problems or dizziness. Headache. Ear pressure or pain. Facial pain on the affected side. Vision changes. Numbness or weakness of the face on the affected side. Signs and symptoms of schwannomatosis usually do not show up until adulthood. They may include: Long-term pain. This is the most common symptom. Development of many tumors throughout the body except in the ear. Numbness. Tingling. How is this diagnosed? This condition may be diagnosed based on your symptoms and a physical exam. Your health care provider may also do tests to help make the diagnosis. These may include: Blood tests. Genetic testing to look for gene mutations. Imaging studies such as an MRI or CT scan. Hearing, vision, and balance tests. Tests for learning disabilities. It may take many years to be diagnosed with the disease because signs and symptoms may develop slowly. How is this treated? There is no cure for this condition, but treatment can relieve symptoms. Treatment may include: Medicines to control pain or seizures. Physical therapy for disabilities. Screening and close monitoring for complications such as neurofibromas in the eye and brain. Surgery to  remove tumors that cause symptoms or become disfiguring. There is a small chance that tumors in people with NF1 will become cancerous. Tumors that become cancerous may need to be treated with: Surgery. Radiation. Medicines that kill cancer cells (chemotherapy). Combination of surgery, radiation, or chemotherapy. Follow these instructions at home: Medicines Take over-the-counter and prescription medicines only as told by your health care provider. Ask your health care provider if the medicine prescribed to you: Requires you to avoid driving or using machinery. Can cause constipation. You may need to take these actions to prevent or  treat constipation: Drink enough fluid to keep your urine pale yellow. Take over-the-counter or prescription medicine. Eat foods that are high in fiber, such as beans, whole grains, and fresh fruits and vegetables. Limit foods that are high in fat and processed sugars, such as fried or sweet foods. General instructions Avoid activities that may put you at risk for injury. This is especially important if you have dizziness or seizures. Ask your health care provider what activities are safe for you. Pay attention to any changes in your symptoms. Tell your health care provider about any changes or new symptoms. Keep all follow-up visits. This is important. This may include visits for tests, therapy, or counseling. Where to find more information [employer] of Neurological Disorders and Stroke: [email or web address] Contact a health care provider if: You develop any new symptoms. Your symptoms get worse. Get help right away if: You have a seizure. You have a severe headache. You have a sudden change in vision, balance, or hearing. These symptoms may be an emergency. Get help right away. Call 911. Do not wait to see if the symptoms will go away. Do not drive yourself to the hospital. Summary Neurofibromatosis is a rare genetic disorder that causes skin and bone changes and the development of nerve tumors. The tumors vary depending on the type of neurofibromatosis you have, but they are usually not cancerous (are usually benign). Your health care provider can diagnose the condition based on your symptoms and a physical exam. There is no cure for this condition. Treatment for your symptoms will be done by a team of health care specialists. It may include medicines, therapy, and surgery. This information is not intended to replace advice given to you by your health care provider. Make sure you discuss any questions you have with your health care provider. Document Revised: 08/03/2021 Document  Reviewed: 08/03/2021 Elsevier Patient Education  [place].

## 2023-01-24 NOTE — Progress Notes (Signed)
[facility]    Provider:  Dr Jaynee [name] Requesting Provider: Rosalee [name], * Primary Care Provider:  Rosalee Kaufman, PA-C  CC:  migraines  HPI:  [name] [name] is a 57 y.o. female here as requested by [name] [name], * for migraines.  Medical history migraines, hypothyroidism, constipation, we also have a history of neurofibromatosis for patient and she saw [name]. Jannifer [name] my colleague in 2018 for this.  I reviewed [employer] notes, she has tried several medications for her headaches and not have help much, she has had side effects from Topamax, her headaches are more frequent and are lasting longer.  Located diffusely.  Aching pressure throbbing and worsening.  Symptom is ongoing. Notes from [name]. Jannifer [name] in the past state history of type I neurofibromatosis but patient has no recollection of being diagnosed with this. Per [name]. Jannifer [name] in 2016 "[name] [name] is a 58 year old right-handed white female with a history of skin lesions consistent with neurofibromatosis. The patient herself does not know anything about this, she denies any family history of other individuals with similar skin lesions. She is unaware of anyone in the family with the diagnosis of neurofibromatosis. She is not sure how long she has had the neurofibroma. 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 The neurofibroma are small, and do not appear to be resulting in any functional issues with her. The patient denies any family history of neurofibromatosis."  She has ad migraines for decades and has tried a lot of medications. Start in the forehead, then her whole face hurts and her mouth hurts, pounding/pulsating/moderate to severe, nausea, photophonia/phonophobia, tylenol helps, going straight to bed in a dark room helps. Can last 24-48 hours. Having at least 15 migraine days month and daily headaches. No aura. No medication oveuse. Ongoing  > 6 months at this severity and frequency. Had Headaches with flu A. Headaches are worsening. No vision loss. No other focal neurologic deficits, associated symptoms, inciting events or modifiable factors.  Reviewed notes, labs and imaging from outside physicians, which showed:  From a thorough review of records, medications tried for migraines include topiramate, Tylenol, Lexapro, Phenergan, tizanidine, amitriptyline and nortriptyline are contraindicated since she is already on Lexapro which is a serotonin drug and the combination can cause serotonin syndrome, propranolol and other high blood pressure medications are contraindicated due to hypotension, also tried nortriptyline in the past. Triptans contraindicated due to increased risk of stroke in NF1. Aimovig contraindicated due to constipation. Patients with NF1 have an increased risk of stroke would not use triptans.  Mri brain 09/2016: FINDINGS: On sagittal images, the spinal cord is imaged caudally to C4 and is normal in caliber.   The contents of the posterior fossa are of normal size and position.   The pituitary gland and optic chiasm appear normal.    Brain volume appears normal.   The ventricles are normal in size and without distortion.  There are no abnormal extra-axial collections of fluid.     The cerebellum and brainstem appears normal.   The deep gray matter appears normal.  The cerebral hemispheres appear normal.  Diffusion weighted images are normal.  Susceptibility weighted images are normal.      The orbits appear normal.   The VIIth/VIIIth nerve complex appears normal.  The mastoid air cells appear normal.  The paranasal sinuses appear normal.  Flow voids are identified within the major intracerebral arteries.      After  the infusion of contrast material, a normal enhancement pattern is noted.       IMPRESSION:  This is a normal MRI of the brain with and without contrast.      Latest Ref Rng & Units 08/29/2016    3:24 PM   CBC  WBC 3.4 - 10.8 x10E3/uL 4.9   Hemoglobin 11.1 - 15.9 g/dL 11.8   Hematocrit 34.0 - 46.6 % 34.4   Platelets 150 - 379 x10E3/uL 226       Latest Ref Rng & Units 08/29/2016    3:24 PM  CMP  Glucose 65 - 99 mg/dL 84   BUN 6 - 24 mg/dL 13   Creatinine 0.57 - 1.00 mg/dL 0.99   Sodium 134 - 144 mmol/L 144   Potassium 3.5 - 5.2 mmol/L 4.7   Chloride 96 - 106 mmol/L 104   CO2 18 - 29 mmol/L 25   Calcium 8.7 - 10.2 mg/dL 9.3   Total Protein 6.0 - 8.5 g/dL 7.1   Total Bilirubin 0.0 - 1.2 mg/dL 0.7   Alkaline Phos 39 - 117 IU/L 92   AST 0 - 40 IU/L 20   ALT 0 - 32 IU/L 17     Review of Systems: Patient complains of symptoms per HPI as well as the following symptoms positional headaches. Pertinent negatives and positives per HPI. All others negative.   Social History   Socioeconomic History   Marital status: Married    Spouse name: Not on file   Number of children: 2   Years of education: 12   Highest education level: Not on file  Occupational History   Occupation: unemployed  Tobacco Use   Smoking status: Former    Types: Cigarettes    Quit date: 12/12/1988    Years since quitting: 34.1   Smokeless tobacco: Never  Vaping Use   Vaping Use: Never used  Substance and Sexual Activity   Alcohol use: No   Drug use: No   Sexual activity: Not on file  Other Topics Concern   Not on file  Social History Narrative   Patient drinks 2 cups of decaffeine daily.   Patient is right handed.   Social Determinants of Health   Financial Resource Strain: Not on file  Food Insecurity: Not on file  Transportation Needs: Not on file  Physical Activity: Not on file  Stress: Not on file  Social Connections: Not on file  Intimate Partner Violence: Not on file    Family History  Problem Relation Age of Onset   Breast cancer Mother    Hyperlipidemia Father    Hypertension Father    CAD Father    Breast cancer Sister    CAD Brother    Breast cancer Paternal Aunt     Neurofibromatosis Neg Hx    Colon cancer Neg Hx     Past Medical History:  Diagnosis Date   Anxiety    Back pain    Breast mass    Common migraine with intractable migraine 08/24/2015   Depression    Dysphagia    GERD (gastroesophageal reflux disease)    Headache    Hypothyroid    Neurofibromatosis ([place])    Neurofibromatosis, type 1 ([place]) 08/24/2015   Vertigo     Patient Active Problem List   Diagnosis Date Noted   Chronic migraine without aura without status migrainosus, not intractable 01/24/2023   Dysphagia 04/08/2019   Abdominal pain 10/22/2018   Constipation 10/22/2018   Neurofibromatosis, type 1 (  [place]) 08/24/2015   Common migraine with intractable migraine 08/24/2015    Past Surgical History:  Procedure Laterality Date   BIOPSY  05/07/2019   Procedure: BIOPSY;  Surgeon: Danie Binder, MD;  Location: [facility];  Service: Endoscopy;;  duodenum gastric   BLADDER SURGERY     BREAST LUMPECTOMY     CHOLECYSTECTOMY     COLONOSCOPY  03/2018   [name]. Karlyn Agee colon prep excellent, normal colon.   ESOPHAGOGASTRODUODENOSCOPY (EGD) WITH PROPOFOL N/A 05/07/2019   Fields: Esophagus appeared normal, empiric dilatation due to dysphagia.  Gastritis/peptic duodenitis.  Biopsies negative for H. pylori and celiac disease.   SAVORY DILATION N/A 05/07/2019   Procedure: SAVORY DILATION;  Surgeon: Danie Binder, MD;  Location: [facility];  Service: Endoscopy;  Laterality: N/A;   TUBAL LIGATION      Current Outpatient Medications  Medication Sig Dispense Refill   acetaminophen (TYLENOL) 500 MG tablet Take 500 mg by mouth every 6 (six) hours as needed (pain).      escitalopram (LEXAPRO) 10 MG tablet Take 10 mg by mouth daily.      Fremanezumab-vfrm (AJOVY) 225 MG/1.5ML SOAJ Inject 225 mg into the skin every 30 (thirty) days. 1.5 mL 11   levothyroxine (SYNTHROID) 75 MCG tablet Take 75 mcg by mouth daily before breakfast.   1   lubiprostone (AMITIZA) 8 MCG capsule Take 1  capsule (8 mcg total) by mouth 2 (two) times daily with a meal. 60 capsule 11   omeprazole (PRILOSEC) 20 MG capsule Take 20 mg by mouth daily.     polyethylene glycol powder (GLYCOLAX/MIRALAX) 17 GM/SCOOP powder One capful (17 grams) once daily for constipation. 527 g 5   Rimegepant Sulfate (NURTEC) 75 MG TBDP Take 1 tablet (75 mg total) by mouth daily as needed. For migraines. Take as close to onset of migraine as possible. One daily maximum. 6 tablet 0   Current Facility-Administered Medications  Medication Dose Route Frequency Provider Last Rate Last Admin   Fremanezumab-vfrm SOSY 225 mg  225 mg Subcutaneous Once Melvenia Beam, MD        Allergies as of 01/24/2023 - Review Complete 01/24/2023  Allergen Reaction Noted   Aspirin Other (See Comments) 01/07/2023    Vitals: BP 117/78   Pulse 74   Ht 5' 6"$  (1.676 m)   Wt 126 lb 6.4 oz (57.3 kg)   BMI 20.40 kg/m  Last Weight:  Wt Readings from Last 1 Encounters:  01/24/23 126 lb 6.4 oz (57.3 kg)   Last Height:   Ht Readings from Last 1 Encounters:  01/24/23 5' 6"$  (1.676 m)     Physical exam: Exam: Gen: NAD, conversant, well nourised, well groomed                     CV: RRR, no MRG. No Carotid Bruits. No peripheral edema, warm, nontender Eyes: Conjunctivae clear without exudates or hemorrhage  Neuro: Detailed Neurologic Exam  Speech:    Speech is normal; fluent and spontaneous with normal comprehension.  Cognition:    The patient is oriented to person, place, and time;     recent and remote memory intact;     language fluent;     normal attention, concentration,     fund of knowledge Cranial Nerves:    The pupils are equal, round, and reactive to light. The fundi are normal and spontaneous venous pulsations are present. Visual fields are full to finger confrontation. Exotropia right eye. Extraocular  movements are intact. Trigeminal sensation is intact and the muscles of mastication are normal. The face is symmetric.  The palate elevates in the midline. Hearing intact. Voice is normal. Shoulder shrug is normal. The tongue has normal motion without fasciculations.   Coordination: nml  Gait: nml  Motor Observation:    No asymmetry, no atrophy, and no involuntary movements noted. Tone:    Normal muscle tone.    Posture:    Posture is normal. normal erect    Strength:    Strength is V/V in the upper and lower limbs.      Sensation: intact to LT     Reflex Exam:  DTR's:    Deep tendon reflexes in the upper and lower extremities are brisk bilaterally.   Toes:    The toes are upgoing bilaterally.   Clonus:    2 beats clonus AJs.    Assessment/Plan:  [name] [name] is a 57 year old right-handed white female with a history of skin lesions consistent with neurofibromatosis. The patient herself does not know anything about this, she denies any family history of other individuals with similar skin lesions. She is unaware of anyone in the family with the diagnosis of neurofibromatosis. She is not sure how long she has had the neurofibroma. The neurofibroma are small, and do not appear to be resulting in any functional issues with her. The patient denies any family history of neurofibromatosis. She is really concerned with worsening headaches, headaches are more common in NF1, chronic migraines will treat and check MRi brain.   - MRI of the brain w/wo contrast - [facility] - Preventative: Start Ajovy monthly for migraines (may need to change to [facility]) - take on the 1st of every month - Emergency: At next appointment can try to discuss acute management, increased risk of stroke in NF1 so wouldn't use triptans, try to get nurtec or ubrelvy approved. - NF1? Discuss going to dermatologist with Tawni Carnes but at this time doesn't appear to be bothering her, appears only to have skin fibromas, but will MRI brain to ensure no new fibromas int he brain due to worsening headaches.   Orders Placed This Encounter   Procedures   MR BRAIN W WO CONTRAST   Meds ordered this encounter  Medications   Rimegepant Sulfate (NURTEC) 75 MG TBDP    Sig: Take 1 tablet (75 mg total) by mouth daily as needed. For migraines. Take as close to onset of migraine as possible. One daily maximum.    Dispense:  6 tablet    Refill:  0   Fremanezumab-vfrm (AJOVY) 225 MG/1.5ML SOAJ    Sig: Inject 225 mg into the skin every 30 (thirty) days.    Dispense:  1.5 mL    Refill:  11   Fremanezumab-vfrm SOSY 225 mg    03-2025 [id number]    Cc: [name] [name], *,  [name] [name], [place]  Sarina Ill, MD  [facility] [address] [place], [place] [address]  Phone [phone] Fax [phone]

## 2023-01-25 ENCOUNTER — Telehealth: Payer: Self-pay | Admitting: *Deleted

## 2023-01-25 NOTE — Telephone Encounter (Signed)
Completed Ajovy PA on CMM. Key: [id number].    Approval number and dates faxed to pharmacy. Received a receipt of confirmation.

## 2023-01-25 NOTE — Telephone Encounter (Signed)
PA # [id number] Approved 01/25/23 - 04/25/23

## 2023-02-23 ENCOUNTER — Telehealth: Payer: Self-pay | Admitting: Neurology

## 2023-02-23 NOTE — Telephone Encounter (Signed)
[facility] [place]: [id number] exp. 02/23/23-04/23/23 sent to GI [phone]

## 2023-03-17 ENCOUNTER — Ambulatory Visit
Admission: RE | Admit: 2023-03-17 | Discharge: 2023-03-17 | Disposition: A | Discharge status: Home / Self Care | Payer: Medicaid Other | Source: Ambulatory Visit | Attending: Neurology | Admitting: Neurology

## 2023-03-17 DIAGNOSIS — R519 Headache, unspecified: Secondary | ICD-10-CM

## 2023-03-17 DIAGNOSIS — R258 Other abnormal involuntary movements: Secondary | ICD-10-CM

## 2023-03-17 DIAGNOSIS — G1229 Other motor neuron disease: Secondary | ICD-10-CM

## 2023-03-17 DIAGNOSIS — Q8501 Neurofibromatosis, type 1: Secondary | ICD-10-CM

## 2023-03-17 DIAGNOSIS — R292 Abnormal reflex: Secondary | ICD-10-CM

## 2023-03-17 DIAGNOSIS — R51 Headache with orthostatic component, not elsewhere classified: Secondary | ICD-10-CM

## 2023-03-17 MED ORDER — GADOPICLENOL 0.5 MMOL/ML IV SOLN
6.0000 mL | Freq: Once | INTRAVENOUS | Status: AC | PRN
  Administered 2023-03-17: 6 mL via INTRAVENOUS

## 2023-03-20 ENCOUNTER — Telehealth: Payer: Self-pay | Admitting: Neurology

## 2023-03-20 NOTE — Telephone Encounter (Signed)
GI said that she is going in tomorrow at 2:10pm for a the correct MRI.

## 2023-03-20 NOTE — Telephone Encounter (Signed)
Pod 4: The MRI of the brain looks fine but they performed the wrong protool. I wanted the whole brain and they focused on the trigeminal nerves which I didn't order and is no where in my notes. Good news everything looks ok bad news they may need to repeat a few images they left out. Let patient know and I;ll ask Tori to contact [facility] thanks  Tori: I spoke with [facility] reading room. They will research, can you call them back this week    Vicente Serene will look into it. thanks

## 2023-03-21 ENCOUNTER — Telehealth: Payer: Self-pay | Admitting: *Deleted

## 2023-03-21 DIAGNOSIS — G43709 Chronic migraine without aura, not intractable, without status migrainosus: Secondary | ICD-10-CM

## 2023-03-21 NOTE — Telephone Encounter (Signed)
Spoke to patient gave results Made patient aware  Per Dr Lucia Gaskins wrong protocol was done will have to do MRI again Pt sates already had MRI today Informed patient will call her back with results Pt  expressed understanding and thanked me for calling

## 2023-03-21 NOTE — Telephone Encounter (Signed)
-----   Message from Anson Fret, MD sent at 03/20/2023  3:41 PM EDT ----- The MRI of the brain looks fine but they performed the wrong protool. I wanted the whole brain and they focused on the trigeminal nerves which I didn't order. Good news everything looks ok bad news they may need to repeat the images they left out. Let patient know and I;ll ask Tori to contact [name] thanks

## 2023-03-27 NOTE — Telephone Encounter (Signed)
Pt called, Requesting MRI results.

## 2023-03-28 NOTE — Telephone Encounter (Signed)
The addendum stated normal brain thank you

## 2023-03-29 ENCOUNTER — Telehealth: Payer: Self-pay | Admitting: *Deleted

## 2023-03-29 NOTE — Telephone Encounter (Signed)
-----   Message from Anson Fret, MD sent at 03/28/2023  2:21 PM EDT ----- Looks like she went and got the additional imaging, would you be able to addend your report Dr. Epimenio Foot?

## 2023-03-29 NOTE — Telephone Encounter (Signed)
This was addended.

## 2023-03-29 NOTE — Telephone Encounter (Signed)
Call to patient to give MRI results. Patient states she is having daily headaches and has nothing in the home to take other than tylenol and has used all the sample Nurtec and it was effective. Patient does state that  she only had 1 ajovy injection in the office and 1 sent to the pharmacy and it helped. Reminded patient that desired effects can take up to 3 months. Advised I would discuss with Dr. Lucia Gaskins. Patient appreciative.   Spoke with Dr. Lucia Gaskins and she suggest continuing Ajovy and inquiring with patient on any previous use of steroids (prednisone dose pack) reasoning for not taking NSAIDS, and any history of diabetes.   12:03pm call back to patient, informed her that pharmacy should have Ajovy was approved. Advised that if she went ahead and gave dose this month that would be the 3rd month and and desired results can take 3-4 months. Patient denies history of diabetes, history of using any steroids or prednisone and states the reasoning for not taking NSAIDs( Advil/aleve) is that it causes stomach cramping. She denies any nausea, vomiting, or bleeding. Patient agreed to pick up Ajovy from pharmacy and aware I will inform Dr. Lucia Gaskins of the above. Patient appreciative.

## 2023-03-30 MED ORDER — DIVALPROEX SODIUM ER 500 MG PO TB24: ORAL_TABLET | ORAL | 0 refills | Status: DC

## 2023-03-30 NOTE — Telephone Encounter (Signed)
Call to patient to inform Dr. Lucia Gaskins recommendation continuing Ajovy and to start Depakote DR  BID for 5 days  (days 1-5) and the Depakote DR  daily for 5 days (days 6-10) patient verbalized understanding and stated she picked up the Ajovy this morning. Informed patient of common GI side effects and informed to call if unable to tolerate. Patient verbalized understanding. Advised patient will send to pharmacy on file, patient appreciative. Prescription sent.

## 2023-03-30 NOTE — Addendum Note (Signed)
Addended by: Lenn Cal on: 03/30/2023 02:35 PM   Modules accepted: Orders

## 2023-03-30 NOTE — Telephone Encounter (Signed)
Discussed with team this morning  Needs to take ajovy every month for at least 4 months. And we have a follow up in July we can discuss  In the meantime we can't get her nurtec until she improves to < 8 migraines a month and < 12 headache days a month on the ajovy then she can get approved  In the meantime take 5 days depakote DR bid 5 days depakote DR once daily   thanks

## 2023-04-26 ENCOUNTER — Telehealth: Payer: Self-pay

## 2023-04-26 ENCOUNTER — Other Ambulatory Visit (HOSPITAL_COMMUNITY): Payer: Self-pay

## 2023-04-26 NOTE — Telephone Encounter (Signed)
Pharmacy Patient Advocate Encounter  Prior Authorization for AJOVY (fremanezumab-vfrm) injection 225MG /1.5ML auto-injectors has been approved by [employer] [place] Medicaid (ins).    PA #  PA Case ID: [id number]  Effective dates: 04/26/2023 through 04/25/2024  Copay is $4.00

## 2023-04-26 NOTE — Telephone Encounter (Signed)
Pharmacy Patient Advocate Encounter   Received notification from [facility] that prior authorization for AJOVY (fremanezumab-vfrm) injection 225MG /1.5ML auto-injectors is required/requested.   PA submitted on 04/26/2023 to (ins) [employer] [place] via [employer] or (Medicaid) confirmation # [facility] Status is pending

## 2023-06-22 ENCOUNTER — Encounter: Payer: Self-pay | Admitting: Adult Health

## 2023-06-22 ENCOUNTER — Ambulatory Visit: Payer: Medicaid Other | Admitting: Adult Health

## 2023-09-21 ENCOUNTER — Encounter: Payer: Self-pay | Admitting: *Deleted

## 2023-10-05 ENCOUNTER — Telehealth: Payer: Self-pay | Admitting: Internal Medicine

## 2023-10-05 NOTE — Telephone Encounter (Signed)
Questionnaire is in review.

## 2023-10-10 NOTE — Telephone Encounter (Signed)
  Procedure: Colonoscopy  Height: 5'2 Weight: 120lbs       Have you had a colonoscopy before?  2019 Dr. Lynden Ang  Do you have family history of colon cancer?  no  Do you have a family history of polyps? no  Previous colonoscopy with polyps removed? no  Do you have a history colorectal cancer?   no  Are you diabetic?  no  Do you have a prosthetic or mechanical heart valve? no  Do you have a pacemaker/defibrillator?   no  Have you had endocarditis/atrial fibrillation?  no  Do you use supplemental oxygen/CPAP?  no  Have you had joint replacement within the last 12 months?  no  Do you tend to be constipated or have to use laxatives?  no   Do you have history of alcohol use? If yes, how much and how often.  no  Do you have history or are you using drugs? If yes, what do are you  using?  no  Have you ever had a stroke/heart attack?  no  Have you ever had a heart or other vascular stent placed,?no  Do you take weight loss medication? no  female patients,: have you had a hysterectomy? no                              are you post menopausal?  no                              do you still have your menstrual cycle? no    Date of last menstrual period? 2010  Do you take any blood-thinning medications such as: (Plavix, aspirin, Coumadin, Aggrenox, Brilinta, Xarelto, Eliquis, Pradaxa, Savaysa or Effient)? no  If yes we need the name, milligram, dosage and who is prescribing doctor:               Current Outpatient Medications  Medication Sig Dispense Refill   dicyclomine (BENTYL) 20 MG tablet Take 20 mg by mouth every 6 (six) hours.     levothyroxine (SYNTHROID) 50 MCG tablet Take 50 mcg by mouth daily before breakfast.  1   omeprazole (PRILOSEC) 40 MG capsule Take 40 mg by mouth daily.     Current Facility-Administered Medications  Medication Dose Route Frequency Provider Last Rate Last Admin   Fremanezumab-vfrm SOSY 225 mg  225 mg Subcutaneous Once Anson Fret, MD         Allergies  Allergen Reactions   Aspirin Other (See Comments)    ABDOMINAL PAIN

## 2023-10-10 NOTE — Addendum Note (Signed)
Addended by: Armstead Peaks on: 10/10/2023 08:52 AM   Modules accepted: Orders

## 2023-10-11 NOTE — Telephone Encounter (Signed)
She is not due for screening colonoscopy, last one completed in 2019, due in 2029.  Looks like recently seen by PCP (reviewed referral papers) with abdominal pain, diarrhea. We could offer her ov to determine if her symptoms would require further work up.

## 2023-10-13 ENCOUNTER — Telehealth: Payer: Self-pay | Admitting: Internal Medicine

## 2023-10-13 NOTE — Telephone Encounter (Signed)
I tried calling patient, but had to leave a VM. I told her that she wasn't due a colonoscopy until 2029 but after the provider reviewed her referral it had that she was having abd pain and I was calling to offer an appointment. I asked for a return call to get her scheduled an OV>

## 2023-10-24 ENCOUNTER — Ambulatory Visit: Payer: Medicaid Other | Admitting: Gastroenterology

## 2023-10-24 ENCOUNTER — Encounter: Payer: Self-pay | Admitting: Gastroenterology

## 2023-10-24 VITALS — BP 120/75 | HR 78 | Temp 97.8°F | Ht 66.0 in | Wt 127.4 lb

## 2023-10-24 DIAGNOSIS — K5900 Constipation, unspecified: Secondary | ICD-10-CM | POA: Diagnosis not present

## 2023-10-24 DIAGNOSIS — R1084 Generalized abdominal pain: Secondary | ICD-10-CM

## 2023-10-24 DIAGNOSIS — R1319 Other dysphagia: Secondary | ICD-10-CM

## 2023-10-24 DIAGNOSIS — K219 Gastro-esophageal reflux disease without esophagitis: Secondary | ICD-10-CM | POA: Diagnosis not present

## 2023-10-24 DIAGNOSIS — R1310 Dysphagia, unspecified: Secondary | ICD-10-CM | POA: Diagnosis not present

## 2023-10-24 DIAGNOSIS — R933 Abnormal findings on diagnostic imaging of other parts of digestive tract: Secondary | ICD-10-CM | POA: Insufficient documentation

## 2023-10-24 MED ORDER — LUBIPROSTONE 24 MCG PO CAPS: 24.0000 ug | ORAL_CAPSULE | Freq: Two times a day (BID) | ORAL | 5 refills | Status: DC

## 2023-10-24 NOTE — Progress Notes (Signed)
GI Office Note    Referring Provider: Richardean Chimera, MD Primary Care Physician:  Richardean Chimera, MD  Primary Gastroenterologist: Formerly Dr. Darrick Penna  Chief Complaint   Chief Complaint  Patient presents with   Abdominal Pain    Having issues with abdominal pain with occasional diarrhea/constipation     History of Present Illness   [name] [name] is a 57 y.o. female presenting today for further evaluation.  We recently received referral for screening colonoscopy but patient is not due until 2029 based on her previous colonoscopy in 2019 which was normal.  However clinical notes indicated that she had been having some GI issues.  We offered evaluation.  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  EGD May 2020: Esophagus normal, mild gastritis/peptic duodenitis.  Small bowel biopsies negative for disease.  Gastric biopsies negative for H. pylori.  Colonoscopy April 2019 by Dr. Marcha Solders, prep excellent, normal study, next colonoscopy in 10 years.  Labs from January 2024: White blood cell count 7900, hemoglobin 12.4, platelets 277,000, sodium 137, creatinine 1.05, glucose 99, albumin 3.8, total bilirubin 1, alkaline phosphatase 80, AST 21, ALT 47 normal.  CT abdomen and pelvis with IV contrast January 2024 1. Mild diffuse colonic wall thickening versus underdistention.  Correlate for infectious or inflammatory colitis.  2. Appendix not confidently identified however there is no pericecal  inflammation.  3. Status post cholecystectomy with mild prominence of the biliary  tree favored reservoir effect post cholecystectomy.   Today: chronic abdominal pain. Usually postprandial, generalized. Hates to eat because of the pain. No n/v. Will develop terrible abdominal cramping and have to sit and wait for BM. Finally has small bristol 1 stool. Rarely has diarrhea. Does not take any stool softeners or laxatives. Trying to eat light. Tolerates jello. No  significant weight loss. No brbpr. Stools dark but not black. A lot of heartburn on omeprazole. Takes TUMS which helps. Solid food dysphagia especially to meat.   Medications   Current Outpatient Medications  Medication Sig Dispense Refill   AJOVY 225 MG/1.5ML SOAJ Inject into the skin.     dicyclomine (BENTYL) 20 MG tablet Take 20 mg by mouth every 6 (six) hours.     levothyroxine (SYNTHROID) 50 MCG tablet Take 50 mcg by mouth daily before breakfast.  1   omeprazole (PRILOSEC) 40 MG capsule Take 40 mg by mouth daily.     No current facility-administered medications for this visit.    Allergies   Allergies as of 10/24/2023 - Review Complete 10/24/2023  Allergen Reaction Noted   Aspirin Other (See Comments) 01/07/2023    Past Medical History   Past Medical History:  Diagnosis Date   Anxiety    Back pain    Breast mass    Common migraine with intractable migraine 08/24/2015   Depression    Dysphagia    GERD (gastroesophageal reflux disease)    Headache    Hypothyroid    Neurofibromatosis (HCC)    Neurofibromatosis, type 1 (HCC) 08/24/2015   Vertigo     Past Surgical History   Past Surgical History:  Procedure Laterality Date   BIOPSY  05/07/2019   Procedure: BIOPSY;  Surgeon: West Bali, MD;  Location: [facility];  Service: Endoscopy;;  duodenum gastric   BLADDER SURGERY     BREAST LUMPECTOMY     CHOLECYSTECTOMY     COLONOSCOPY  03/2018   Dr. Fraser Din colon prep excellent, normal colon.  ESOPHAGOGASTRODUODENOSCOPY (EGD) WITH PROPOFOL N/A 05/07/2019   Fields: Esophagus appeared normal, empiric dilatation due to dysphagia.  Gastritis/peptic duodenitis.  Biopsies negative for H. pylori and celiac disease.   SAVORY DILATION N/A 05/07/2019   Procedure: SAVORY DILATION;  Surgeon: West Bali, MD;  Location: [facility];  Service: Endoscopy;  Laterality: N/A;   TUBAL LIGATION      Past Family History   Family History  Problem Relation Age of Onset    Breast cancer Mother    Hyperlipidemia Father    Hypertension Father    CAD Father    Breast cancer Sister    CAD Brother    Breast cancer Paternal Aunt    Neurofibromatosis Neg Hx    Colon cancer Neg Hx     Past Social History   Social History   Socioeconomic History   Marital status: Married    Spouse name: Not on file   Number of children: 2   Years of education: 12   Highest education level: Not on file  Occupational History   Occupation: unemployed  Tobacco Use   Smoking status: Former    Current packs/day: 0.00    Types: Cigarettes    Quit date: 12/12/1988    Years since quitting: 34.8   Smokeless tobacco: Never  Vaping Use   Vaping status: Never Used  Substance and Sexual Activity   Alcohol use: No   Drug use: No   Sexual activity: Not on file  Other Topics Concern   Not on file  Social History Narrative   Patient drinks 2 cups of decaffeine daily.   Patient is right handed.   Social Determinants of Health   Financial Resource Strain: Not on file  Food Insecurity: Not on file  Transportation Needs: Not on file  Physical Activity: Not on file  Stress: Not on file  Social Connections: Not on file  Intimate Partner Violence: Not on file    Review of Systems   General: Negative for anorexia, weight loss, fever, chills, fatigue, weakness. Eyes: Negative for vision changes.  ENT: Negative for hoarseness,  nasal congestion. CV: Negative for chest pain, angina, palpitations, dyspnea on exertion, peripheral edema.  Respiratory: Negative for dyspnea at rest, dyspnea on exertion, cough, sputum, wheezing.  GI: See history of present illness. GU:  Negative for dysuria, hematuria, urinary incontinence, urinary frequency, nocturnal urination.  MS: Negative for joint pain, low back pain.  Derm: Negative for rash or itching.  Neuro: Negative for weakness, abnormal sensation, seizure, frequent headaches, memory loss,  confusion.  Psych: Negative for anxiety,  depression, suicidal ideation, hallucinations.  Endo: Negative for unusual weight change.  Heme: Negative for bruising or bleeding. Allergy: Negative for rash or hives.  Physical Exam   BP 120/75 (BP Location: Right Arm, Patient Position: Sitting, Cuff Size: Normal)   Pulse 78   Temp 97.8 F (36.6 C) (Oral)   Ht 5\' 6"  (1.676 m)   Wt 127 lb 6.4 oz (57.8 kg)   SpO2 98%   BMI 20.56 kg/m    General: Well-nourished, well-developed in no acute distress.  Head: Normocephalic, atraumatic.   Eyes: Conjunctiva pink, no icterus. Mouth: Oropharyngeal mucosa moist and pink ,  Neck: Supple without thyromegaly, masses, or lymphadenopathy.  Lungs: Clear to auscultation bilaterally.  Heart: Regular rate and rhythm, no murmurs rubs or gallops.  Abdomen: Bowel sounds are normal, nontender, nondistended, no hepatosplenomegaly or masses,  no abdominal bruits or hernia, no rebound or guarding.   Rectal: not  performed Extremities: No lower extremity edema. No clubbing or deformities.  Neuro: Alert and oriented x 4 , grossly normal neurologically.  Skin: Warm and dry, no rash or jaundice.   Psych: Alert and cooperative, normal mood and affect.  Labs   See hpi  Imaging Studies   No results found.  Assessment/Plan:   GERD/Dysphagia/abdominal pain -breakthrough heartburn symptoms on omeprazole 40mg  daily. Responds to OTC antacids, continue current regimen for now. Evaluate at time of EGD -solid food dysphagia, ?esophagitis vs stricture -postprandial abdominal pain, ?dyspepsia, gastritis, ulcer, maintaining her weight  -EGD/ED with Dr. Marletta Lor. ASA 2.  I have discussed the risks, alternatives, benefits with regards to but not limited to the risk of reaction to medication, bleeding, infection, perforation and the patient is agreeable to proceed. Written consent to be obtained.  Abdominal pain/Constipation -?secondary to IBS-C, start Amitiza BID with food -colonoscopy planned given abnormal  colon on CT earlier this year. ASA 2.  I have discussed the risks, alternatives, benefits with regards to but not limited to the risk of reaction to medication, bleeding, infection, perforation and the patient is agreeable to proceed. Written consent to be obtained.  If EGD/colonoscopy unremarkable, without explanation for abdominal pain, she may require additional imaging.     Leanna Battles. Melvyn Neth, MHS, PA-C [facility]

## 2023-10-24 NOTE — Patient Instructions (Addendum)
Continue omeprazole 40mg  daily before breakfast. Limit dicyclomine to more than twice daily for abdominal pain as this will worsen constipation. Start lubiprostone twice daily with meals for constipation and abdominal pain. You will see improvement in constipation within 2 days but effects for abdominal pain can take several weeks. Hold medication for 24 hours if you have more than 3 watery stools in 24 hours.  Colonoscopy and upper endoscopy to be scheduled.

## 2023-12-14 ENCOUNTER — Telehealth: Payer: Self-pay | Admitting: *Deleted

## 2023-12-14 NOTE — Telephone Encounter (Signed)
 [facility]  TCS/EGD/ED w/Dr.Carver, asa 2

## 2023-12-15 NOTE — Telephone Encounter (Signed)
Pt lmom returning your call

## 2023-12-19 ENCOUNTER — Other Ambulatory Visit: Payer: Self-pay | Admitting: *Deleted

## 2023-12-19 ENCOUNTER — Encounter: Payer: Self-pay | Admitting: *Deleted

## 2023-12-19 MED ORDER — PEG 3350-KCL-NA BICARB-NACL 420 G PO SOLR: 4000.0000 mL | Freq: Once | ORAL | 0 refills | Status: AC

## 2023-12-19 NOTE — Progress Notes (Signed)
 Error

## 2023-12-19 NOTE — Telephone Encounter (Signed)
 LMTRC

## 2023-12-20 NOTE — Telephone Encounter (Signed)
 Pt has been scheduled for 01/02/24. Instructions mailed and prep sent to the pharmacy.

## 2024-01-02 ENCOUNTER — Encounter (HOSPITAL_COMMUNITY): Payer: Self-pay | Admitting: Internal Medicine

## 2024-01-02 ENCOUNTER — Ambulatory Visit (HOSPITAL_COMMUNITY)
Admission: RE | Admit: 2024-01-02 | Discharge: 2024-01-02 | Disposition: A | Discharge status: Home / Self Care | Payer: Medicaid Other | Attending: Internal Medicine | Admitting: Internal Medicine

## 2024-01-02 ENCOUNTER — Encounter (HOSPITAL_COMMUNITY)
Admission: RE | Disposition: A | Discharge status: Home / Self Care | Payer: Self-pay | Source: Home / Self Care | Attending: Internal Medicine

## 2024-01-02 ENCOUNTER — Ambulatory Visit (HOSPITAL_BASED_OUTPATIENT_CLINIC_OR_DEPARTMENT_OTHER): Payer: Medicaid Other | Admitting: Anesthesiology

## 2024-01-02 ENCOUNTER — Ambulatory Visit (HOSPITAL_COMMUNITY): Payer: Self-pay | Admitting: Anesthesiology

## 2024-01-02 ENCOUNTER — Other Ambulatory Visit: Payer: Self-pay

## 2024-01-02 DIAGNOSIS — K209 Esophagitis, unspecified without bleeding: Secondary | ICD-10-CM | POA: Diagnosis not present

## 2024-01-02 DIAGNOSIS — K649 Unspecified hemorrhoids: Secondary | ICD-10-CM

## 2024-01-02 DIAGNOSIS — K317 Polyp of stomach and duodenum: Secondary | ICD-10-CM | POA: Insufficient documentation

## 2024-01-02 DIAGNOSIS — K2970 Gastritis, unspecified, without bleeding: Secondary | ICD-10-CM | POA: Diagnosis not present

## 2024-01-02 DIAGNOSIS — K2100 Gastro-esophageal reflux disease with esophagitis, without bleeding: Secondary | ICD-10-CM | POA: Insufficient documentation

## 2024-01-02 DIAGNOSIS — K648 Other hemorrhoids: Secondary | ICD-10-CM | POA: Diagnosis not present

## 2024-01-02 DIAGNOSIS — K529 Noninfective gastroenteritis and colitis, unspecified: Secondary | ICD-10-CM

## 2024-01-02 DIAGNOSIS — Z87891 Personal history of nicotine dependence: Secondary | ICD-10-CM | POA: Insufficient documentation

## 2024-01-02 DIAGNOSIS — K2950 Unspecified chronic gastritis without bleeding: Secondary | ICD-10-CM

## 2024-01-02 DIAGNOSIS — K635 Polyp of colon: Secondary | ICD-10-CM

## 2024-01-02 DIAGNOSIS — D123 Benign neoplasm of transverse colon: Secondary | ICD-10-CM | POA: Diagnosis not present

## 2024-01-02 DIAGNOSIS — E039 Hypothyroidism, unspecified: Secondary | ICD-10-CM | POA: Diagnosis not present

## 2024-01-02 DIAGNOSIS — K449 Diaphragmatic hernia without obstruction or gangrene: Secondary | ICD-10-CM | POA: Diagnosis not present

## 2024-01-02 DIAGNOSIS — R1310 Dysphagia, unspecified: Secondary | ICD-10-CM

## 2024-01-02 DIAGNOSIS — K2289 Other specified disease of esophagus: Secondary | ICD-10-CM | POA: Diagnosis not present

## 2024-01-02 DIAGNOSIS — I10 Essential (primary) hypertension: Secondary | ICD-10-CM | POA: Insufficient documentation

## 2024-01-02 DIAGNOSIS — K2270 Barrett's esophagus without dysplasia: Secondary | ICD-10-CM | POA: Diagnosis not present

## 2024-01-02 DIAGNOSIS — R933 Abnormal findings on diagnostic imaging of other parts of digestive tract: Secondary | ICD-10-CM | POA: Diagnosis present

## 2024-01-02 HISTORY — PX: BIOPSY: SHX5522

## 2024-01-02 HISTORY — PX: ESOPHAGOGASTRODUODENOSCOPY (EGD) WITH PROPOFOL: SHX5813

## 2024-01-02 HISTORY — PX: POLYPECTOMY: SHX5525

## 2024-01-02 HISTORY — PX: COLONOSCOPY WITH PROPOFOL: SHX5780

## 2024-01-02 SURGERY — COLONOSCOPY WITH PROPOFOL
Anesthesia: General

## 2024-01-02 MED ORDER — PROPOFOL 10 MG/ML IV BOLUS
INTRAVENOUS | Status: DC | PRN
  Administered 2024-01-02: 40 mg via INTRAVENOUS
  Administered 2024-01-02: 70 mg via INTRAVENOUS
  Administered 2024-01-02: 30 mg via INTRAVENOUS
  Administered 2024-01-02: 40 mg via INTRAVENOUS

## 2024-01-02 MED ORDER — LACTATED RINGERS IV SOLN: INTRAVENOUS | Status: DC

## 2024-01-02 MED ORDER — PHENYLEPHRINE 80 MCG/ML (10ML) SYRINGE FOR IV PUSH (FOR BLOOD PRESSURE SUPPORT)
PREFILLED_SYRINGE | INTRAVENOUS | Status: AC
  Filled 2024-01-02: qty 10

## 2024-01-02 MED ORDER — LIDOCAINE HCL (PF) 2 % IJ SOLN
INTRAMUSCULAR | Status: AC
  Filled 2024-01-02: qty 5

## 2024-01-02 MED ORDER — STERILE WATER FOR IRRIGATION IR SOLN
Status: DC | PRN
  Administered 2024-01-02: 120 mL

## 2024-01-02 MED ORDER — OMEPRAZOLE 40 MG PO CPDR: 40.0000 mg | DELAYED_RELEASE_CAPSULE | Freq: Two times a day (BID) | ORAL | 11 refills | Status: DC

## 2024-01-02 MED ORDER — LIDOCAINE HCL (CARDIAC) PF 100 MG/5ML IV SOSY
PREFILLED_SYRINGE | INTRAVENOUS | Status: DC | PRN
  Administered 2024-01-02: 60 mg via INTRAVENOUS

## 2024-01-02 MED ORDER — PROPOFOL 500 MG/50ML IV EMUL
INTRAVENOUS | Status: DC | PRN
  Administered 2024-01-02: 150 ug/kg/min via INTRAVENOUS

## 2024-01-02 MED ORDER — PHENYLEPHRINE 80 MCG/ML (10ML) SYRINGE FOR IV PUSH (FOR BLOOD PRESSURE SUPPORT)
PREFILLED_SYRINGE | INTRAVENOUS | Status: DC | PRN
  Administered 2024-01-02 (×2): 80 ug via INTRAVENOUS

## 2024-01-02 NOTE — Discharge Instructions (Addendum)
EGD Discharge instructions Please read the instructions outlined below and refer to this sheet in the next few weeks. These discharge instructions provide you with general information on caring for yourself after you leave the hospital. Your doctor may also give you specific instructions. While your treatment has been planned according to the most current medical practices available, unavoidable complications occasionally occur. If you have any problems or questions after discharge, please call your doctor. ACTIVITY You may resume your regular activity but move at a slower pace for the next 24 hours.  Take frequent rest periods for the next 24 hours.  Walking will help expel (get rid of) the air and reduce the bloated feeling in your abdomen.  No driving for 24 hours (because of the anesthesia (medicine) used during the test).  You may shower.  Do not sign any important legal documents or operate any machinery for 24 hours (because of the anesthesia used during the test).  NUTRITION Drink plenty of fluids.  You may resume your normal diet.  Begin with a light meal and progress to your normal diet.  Avoid alcoholic beverages for 24 hours or as instructed by your caregiver.  MEDICATIONS You may resume your normal medications unless your caregiver tells you otherwise.  WHAT YOU CAN EXPECT TODAY You may experience abdominal discomfort such as a feeling of fullness or "gas" pains.  FOLLOW-UP Your doctor will discuss the results of your test with you.  SEEK IMMEDIATE MEDICAL ATTENTION IF ANY OF THE FOLLOWING OCCUR: Excessive nausea (feeling sick to your stomach) and/or vomiting.  Severe abdominal pain and distention (swelling).  Trouble swallowing.  Temperature over 101 F (37.8 C).  Rectal bleeding or vomiting of blood.     Colonoscopy Discharge Instructions  Read the instructions outlined below and refer to this sheet in the next few weeks. These discharge instructions provide you with  general information on caring for yourself after you leave the hospital. Your doctor may also give you specific instructions. While your treatment has been planned according to the most current medical practices available, unavoidable complications occasionally occur.   ACTIVITY You may resume your regular activity, but move at a slower pace for the next 24 hours.  Take frequent rest periods for the next 24 hours.  Walking will help get rid of the air and reduce the bloated feeling in your belly (abdomen).  No driving for 24 hours (because of the medicine (anesthesia) used during the test).   Do not sign any important legal documents or operate any machinery for 24 hours (because of the anesthesia used during the test).  NUTRITION Drink plenty of fluids.  You may resume your normal diet as instructed by your doctor.  Begin with a light meal and progress to your normal diet. Heavy or fried foods are harder to digest and may make you feel sick to your stomach (nauseated).  Avoid alcoholic beverages for 24 hours or as instructed.  MEDICATIONS You may resume your normal medications unless your doctor tells you otherwise.  WHAT YOU CAN EXPECT TODAY Some feelings of bloating in the abdomen.  Passage of more gas than usual.  Spotting of blood in your stool or on the toilet paper.  IF YOU HAD POLYPS REMOVED DURING THE COLONOSCOPY: No aspirin products for 7 days or as instructed.  No alcohol for 7 days or as instructed.  Eat a soft diet for the next 24 hours.  FINDING OUT THE RESULTS OF YOUR TEST Not all test results are  available during your visit. If your test results are not back during the visit, make an appointment with your caregiver to find out the results. Do not assume everything is normal if you have not heard from your caregiver or the medical facility. It is important for you to follow up on all of your test results.  SEEK IMMEDIATE MEDICAL ATTENTION IF: You have more than a spotting of  blood in your stool.  Your belly is swollen (abdominal distention).  You are nauseated or vomiting.  You have a temperature over 101.  You have abdominal pain or discomfort that is severe or gets worse throughout the day.   Your upper endoscopy revealed significant esophagitis, small hiatal hernia, as well as evidence of Barrett's esophagus.  I took samples to further evaluate.  I did not see any tightenings or strictures of your esophagus.  You also had mild amount of inflammation in your stomach.  I took samples of this as well.    1 small polyp in your small bowel which I also removed.  We will call with these results.  I am going to increase your omeprazole to twice daily.  We likely need to repeat upper endoscopy in 8 to 12 weeks to evaluate healing of your esophagus.  Your colonoscopy revealed 1 polyp(s) which I removed successfully. Await pathology results, my office will contact you. I recommend repeating colonoscopy in 7 years for surveillance purposes.   Follow-up in GI office in 4 weeks.  I hope you have a great rest of your week!  Hennie Duos. Marletta Lor, D.O. Gastroenterology and Hepatology [facility]

## 2024-01-02 NOTE — Anesthesia Preprocedure Evaluation (Signed)
Anesthesia Evaluation  Patient identified by MRN, date of birth, ID band Patient awake    Reviewed: Allergy & Precautions, H&P , NPO status , Patient's Chart, lab work & pertinent test results, reviewed documented beta blocker date and time   Airway Mallampati: II  TM Distance: >3 FB Neck ROM: full    Dental no notable dental hx.    Pulmonary neg pulmonary ROS, former smoker   Pulmonary exam normal breath sounds clear to auscultation       Cardiovascular Exercise Tolerance: Good hypertension, negative cardio ROS  Rhythm:regular Rate:Normal     Neuro/Psych  Headaches PSYCHIATRIC DISORDERS Anxiety Depression    negative neurological ROS  negative psych ROS   GI/Hepatic negative GI ROS, Neg liver ROS,GERD  ,,  Endo/Other  negative endocrine ROSHypothyroidism    Renal/GU negative Renal ROS  negative genitourinary   Musculoskeletal   Abdominal   Peds  Hematology negative hematology ROS (+)   Anesthesia Other Findings   Reproductive/Obstetrics negative OB ROS                             Anesthesia Physical Anesthesia Plan  ASA: 2  Anesthesia Plan: General   Post-op Pain Management:    Induction:   PONV Risk Score and Plan: Propofol infusion  Airway Management Planned:   Additional Equipment:   Intra-op Plan:   Post-operative Plan:   Informed Consent: I have reviewed the patients History and Physical, chart, labs and discussed the procedure including the risks, benefits and alternatives for the proposed anesthesia with the patient or authorized representative who has indicated his/her understanding and acceptance.     Dental Advisory Given  Plan Discussed with: CRNA  Anesthesia Plan Comments:        Anesthesia Quick Evaluation

## 2024-01-02 NOTE — Op Note (Signed)
[facility] Patient Name: [name] [name] Procedure Date: 01/02/2024 1:41 PM MRN: [id number] Date of Birth: Sep 23, 1966 Attending MD: Hennie Duos. Marletta Lor , [place], [id number] CSN: [id number] Age: 58 Admit Type: Outpatient Procedure:                Upper GI endoscopy Indications:              Epigastric abdominal pain, Dysphagia Providers:                Hennie Duos. Marletta Lor, DO, Angelica Ran, Dyann Ruddle Referring MD:              Medicines:                See the Anesthesia note for documentation of the                            administered medications Complications:            No immediate complications. Estimated Blood Loss:     Estimated blood loss was minimal. Procedure:                Pre-Anesthesia Assessment:                           - The anesthesia plan was to use monitored                            anesthesia care (MAC).                           After obtaining informed consent, the endoscope was                            passed under direct vision. Throughout the                            procedure, the patient's blood pressure, pulse, and                            oxygen saturations were monitored continuously. The                            [id number]-H190 ([id number]) scope was introduced through the                            mouth, and advanced to the second part of duodenum.                            The upper GI endoscopy was accomplished without                            difficulty. The patient tolerated the procedure                            well. Scope In: 1:50:31 PM Scope Out: 1:59:47 PM Total Procedure Duration: 0 hours 9 minutes 16 seconds  Findings:      A small hiatal hernia was present.  LA Grade B (one or more mucosal breaks greater than 5 mm, not extending       between the tops of two mucosal folds) esophagitis with no bleeding was       found in the lower third of the esophagus.      The esophagus and gastroesophageal junction were examined with white        light and narrow band imaging (NBI) from a forward view and retroflexed       position. There were esophageal mucosal changes consistent with       short-segment Barrett's esophagus. These changes involved the mucosa       extending to the Z-line. Salmon-colored mucosa was present and       nodularity was present. The maximum longitudinal extent of these       esophageal mucosal changes was 1-2 cm in length. Mucosa was biopsied       with a cold forceps for histology.      Patchy mild inflammation characterized by erythema was found in the       gastric body and in the gastric antrum. Biopsies were taken with a cold       forceps for Helicobacter pylori testing.      A single 4 mm sessile polyp was found in the second portion of the       duodenum. The polyp was removed with a cold snare. Resection and       retrieval were complete. Impression:               - Small hiatal hernia.                           - LA Grade B reflux esophagitis with no bleeding.                           - Esophageal mucosal changes consistent with                            short-segment Barrett's esophagus. Biopsied.                           - Gastritis. Biopsied.                           - A single duodenal polyp. Resected and retrieved. Moderate Sedation:      Per Anesthesia Care Recommendation:           - Patient has a contact number available for                            emergencies. The signs and symptoms of potential                            delayed complications were discussed with the                            patient. Return to normal activities tomorrow.                            Written discharge instructions were provided to the  patient.                           - Resume previous diet.                           - Continue present medications.                           - Await pathology results.                           - Repeat upper endoscopy in 8  weeks to evaluate the                            response to therapy.                           - Return to GI clinic in 4 weeks.                           - Use a proton pump inhibitor PO BID. Procedure Code(s):        --- Professional ---                           [phone], Esophagogastroduodenoscopy, flexible,                            transoral; with removal of tumor(s), polyp(s), or                            other lesion(s) by snare technique                           [id number], 59, Esophagogastroduodenoscopy, flexible,                            transoral; with biopsy, single or multiple Diagnosis Code(s):        --- Professional ---                           K44.9, Diaphragmatic hernia without obstruction or                            gangrene                           K21.00, Gastro-esophageal reflux disease with                            esophagitis, without bleeding                           K22.89, Other specified disease of esophagus                           K29.70, Gastritis, unspecified, without bleeding  K31.7, Polyp of stomach and duodenum                           R10.13, Epigastric pain                           R13.10, Dysphagia, unspecified CPT copyright 2022 American Medical Association. All rights reserved. The codes documented in this report are preliminary and upon coder review may  be revised to meet current compliance requirements. Hennie Duos. Marletta Lor, DO Hennie Duos. Marletta Lor, DO 01/02/2024 2:06:00 PM This report has been signed electronically. Number of Addenda: 0

## 2024-01-02 NOTE — Op Note (Signed)
[facility] Patient Name: [name] [name] Procedure Date: 01/02/2024 1:27 PM MRN: [id number] Date of Birth: 1966/08/24 Attending MD: Hennie Duos. Marletta Lor , [place], [id number] CSN: [id number] Age: 58 Admit Type: Outpatient Procedure:                Colonoscopy Indications:              Abnormal CT of the GI tract, Follow-up of colitis Providers:                Hennie Duos. Marletta Lor, DO, Angelica Ran, Dyann Ruddle Referring MD:              Medicines:                See the Anesthesia note for documentation of the                            administered medications Complications:            No immediate complications. Estimated Blood Loss:     Estimated blood loss was minimal. Procedure:                Pre-Anesthesia Assessment:                           - The anesthesia plan was to use monitored                            anesthesia care (MAC).                           After obtaining informed consent, the colonoscope                            was passed under direct vision. Throughout the                            procedure, the patient's blood pressure, pulse, and                            oxygen saturations were monitored continuously. The                            [id number]-HQ190L ([id number]) scope was introduced through                            the anus and advanced to the the cecum, identified                            by appendiceal orifice and ileocecal valve. The                            colonoscopy was performed without difficulty. The                            patient tolerated the procedure well. The quality  of the bowel preparation was evaluated using the                            BBPS [facility] Bowel Preparation Scale) with scores                            of: Right Colon = 3, Transverse Colon = 3 and Left                            Colon = 3 (entire mucosa seen well with no residual                            staining, small fragments of stool or opaque                             liquid). The total BBPS score equals 9. Scope In: 2:08:27 PM Scope Out: 2:21:29 PM Scope Withdrawal Time: 0 hours 8 minutes 43 seconds  Total Procedure Duration: 0 hours 13 minutes 2 seconds  Findings:      Hemorrhoids were found on perianal exam.      Non-bleeding internal hemorrhoids were found.      A 5 mm polyp was found in the transverse colon. The polyp was sessile.       The polyp was removed with a cold snare. Resection and retrieval were       complete.      The exam was otherwise without abnormality. Impression:               - Hemorrhoids found on perianal exam.                           - Non-bleeding internal hemorrhoids.                           - One 5 mm polyp in the transverse colon, removed                            with a cold snare. Resected and retrieved.                           - The examination was otherwise normal. Moderate Sedation:      Per Anesthesia Care Recommendation:           - Patient has a contact number available for                            emergencies. The signs and symptoms of potential                            delayed complications were discussed with the                            patient. Return to normal activities tomorrow.  Written discharge instructions were provided to the                            patient.                           - Resume previous diet.                           - Continue present medications.                           - Await pathology results.                           - Repeat colonoscopy in 7 years for surveillance.                           - Return to GI clinic in 4 weeks. Procedure Code(s):        --- Professional ---                           [phone], Colonoscopy, flexible; with removal of                            tumor(s), polyp(s), or other lesion(s) by snare                            technique Diagnosis Code(s):        --- Professional ---                            D12.3, Benign neoplasm of transverse colon (hepatic                            flexure or splenic flexure)                           K64.8, Other hemorrhoids                           K52.9, Noninfective gastroenteritis and colitis,                            unspecified                           R93.3, Abnormal findings on diagnostic imaging of                            other parts of digestive tract CPT copyright 2022 American Medical Association. All rights reserved. The codes documented in this report are preliminary and upon coder review may  be revised to meet current compliance requirements. Hennie Duos. Marletta Lor, DO Hennie Duos. Marletta Lor, DO 01/02/2024 2:35:01 PM This report has been signed electronically. Number of Addenda: 0

## 2024-01-02 NOTE — H&P (Signed)
Primary Care Physician:  Richardean Chimera, MD Primary Gastroenterologist:  Dr. Marletta Lor  Pre-Procedure History & Physical: HPI:  [name] [name] is a 58 y.o. female is here  for an EGD with possible dilation due to history of dysphagia, GERD, and colonoscopy due to abdominal pain and history of colitis.  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 Fraser Din colon prep excellent, normal colon.   ESOPHAGOGASTRODUODENOSCOPY (EGD) WITH PROPOFOL N/A 05/07/2019   Fields: Esophagus appeared normal, empiric dilatation due to dysphagia.  Gastritis/peptic duodenitis.  Biopsies negative for H. pylori and celiac disease.   SAVORY DILATION N/A 05/07/2019   Procedure: SAVORY DILATION;  Surgeon: West Bali, MD;  Location: [facility];  Service: Endoscopy;  Laterality: N/A;   TUBAL LIGATION      Prior to Admission medications   Medication Sig Start Date End Date Taking? Authorizing Provider  AJOVY 225 MG/1.5ML SOAJ Inject into the skin. 10/16/23  Yes [provider]  dicyclomine (BENTYL) 20 MG tablet Take 20 mg by mouth every 6 (six) hours.   Yes [provider]  levothyroxine (SYNTHROID) 50 MCG tablet Take 50 mcg by mouth daily before breakfast. 03/15/16  Yes [provider]  lubiprostone (AMITIZA) 24 MCG capsule Take 1 capsule (24 mcg total) by mouth 2 (two) times daily with a meal. For constipation 10/24/23  Yes  Tiffany Kocher, PA-C  omeprazole (PRILOSEC) 40 MG capsule Take 40 mg by mouth daily.   Yes [provider]    Allergies as of 12/19/2023 - Review Complete 10/24/2023  Allergen Reaction Noted   Aspirin Other (See Comments) 01/07/2023    Family History  Problem Relation Age of Onset   Breast cancer Mother    Hyperlipidemia Father    Hypertension Father    CAD Father    Breast cancer Sister    CAD Brother    Breast cancer Paternal Aunt    Neurofibromatosis Neg Hx    Colon cancer Neg Hx     Social History   Socioeconomic History   Marital status: Married    Spouse name: Not on file   Number of children: 2   Years of education: 12   Highest education level: Not on file  Occupational History   Occupation: unemployed  Tobacco Use   Smoking status: Former    Current packs/day: 0.00    Types: Cigarettes    Quit date: 12/12/1988    Years since quitting: 35.0   Smokeless tobacco: Never  Vaping Use   Vaping status: Never Used  Substance and Sexual Activity   Alcohol use: No   Drug use: No   Sexual activity: Not on file  Other Topics Concern   Not on file  Social History Narrative   Patient drinks 2 cups of decaffeine  daily.   Patient is right handed.   Social Drivers of Corporate investment banker Strain: Not on file  Food Insecurity: Not on file  Transportation Needs: Not on file  Physical Activity: Not on file  Stress: Not on file  Social Connections: Not on file  Intimate Partner Violence: Not on file    Review of Systems: General: Negative for fever, chills, fatigue, weakness. Eyes: Negative for vision changes.  ENT: Negative for hoarseness, difficulty swallowing , nasal congestion. CV: Negative for chest pain, angina, palpitations, dyspnea on exertion, peripheral edema.  Respiratory: Negative for dyspnea at rest, dyspnea on exertion, cough, sputum, wheezing.  GI: See history of present illness. GU:  Negative for dysuria, hematuria, urinary  incontinence, urinary frequency, nocturnal urination.  MS: Negative for joint pain, low back pain.  Derm: Negative for rash or itching.  Neuro: Negative for weakness, abnormal sensation, seizure, frequent headaches, memory loss, confusion.  Psych: Negative for anxiety, depression Endo: Negative for unusual weight change.  Heme: Negative for bruising or bleeding. Allergy: Negative for rash or hives.  Physical Exam: Vital signs in last 24 hours: Temp:  [97.8 F (36.6 C)] 97.8 F (36.6 C) (01/21 1301) Pulse Rate:  [84] 84 (01/21 1301) Resp:  [16] 16 (01/21 1301) BP: (125)/(84) 125/84 (01/21 1301) SpO2:  [99 %] 99 % (01/21 1301) Weight:  [54.4 kg] 54.4 kg (01/21 1301)   General:   Alert,  Well-developed, well-nourished, pleasant and cooperative in NAD Head:  Normocephalic and atraumatic. Eyes:  Sclera clear, no icterus.   Conjunctiva pink. Ears:  Normal auditory acuity. Nose:  No deformity, discharge,  or lesions. Msk:  Symmetrical without gross deformities. Normal posture. Extremities:  Without clubbing or edema. Neurologic:  Alert and  oriented x4;  grossly normal neurologically. Skin:  Intact without significant lesions or rashes. Psych:  Alert and cooperative. Normal mood and affect.   Impression/Plan: [name] [name] is here for an EGD with possible dilation due to history of dysphagia, GERD, and colonoscopy due to abdominal pain and history of colitis.  Risks, benefits, limitations, imponderables and alternatives regarding procedure have been reviewed with the patient. Questions have been answered. All parties agreeable.

## 2024-01-02 NOTE — Transfer of Care (Signed)
Immediate Anesthesia Transfer of Care Note  Patient: [name] [name]  Procedure(s) Performed: COLONOSCOPY WITH PROPOFOL ESOPHAGOGASTRODUODENOSCOPY (EGD) WITH PROPOFOL BIOPSY POLYPECTOMY  Patient Location: Endoscopy Unit  Anesthesia Type:General  Level of Consciousness: awake, alert , and oriented  Airway & Oxygen Therapy: Patient Spontanous Breathing  Post-op Assessment: Report given to RN and Post -op Vital signs reviewed and stable  Post vital signs: Reviewed and stable  Last Vitals:  Vitals Value Taken Time  BP    Temp    Pulse    Resp    SpO2      Last Pain:  Vitals:   01/02/24 1344  TempSrc:   PainSc: 9       Patients Stated Pain Goal: 4 (01/02/24 1301)  Complications: No notable events documented.

## 2024-01-04 ENCOUNTER — Encounter (HOSPITAL_COMMUNITY): Payer: Self-pay | Admitting: Internal Medicine

## 2024-01-04 LAB — SURGICAL PATHOLOGY

## 2024-01-05 NOTE — Anesthesia Postprocedure Evaluation (Signed)
Anesthesia Post Note  Patient: [name] [name]  Procedure(s) Performed: COLONOSCOPY WITH PROPOFOL ESOPHAGOGASTRODUODENOSCOPY (EGD) WITH PROPOFOL BIOPSY POLYPECTOMY  Patient location during evaluation: Phase II Anesthesia Type: General Level of consciousness: awake Pain management: pain level controlled Vital Signs Assessment: post-procedure vital signs reviewed and stable Respiratory status: spontaneous breathing and respiratory function stable Cardiovascular status: blood pressure returned to baseline and stable Postop Assessment: no headache and no apparent nausea or vomiting Anesthetic complications: no Comments: Late entry   No notable events documented.   Last Vitals:  Vitals:   01/02/24 1301 01/02/24 1428  BP: 125/84 (!) 99/55  Pulse: 84 80  Resp: 16 18  Temp: 36.6 C (!) 36.4 C  SpO2: 99% 99%    Last Pain:  Vitals:   01/02/24 1501  TempSrc:   PainSc: 8                  Windell Norfolk

## 2024-01-17 ENCOUNTER — Telehealth: Payer: Self-pay | Admitting: Internal Medicine

## 2024-01-17 NOTE — Telephone Encounter (Signed)
 See result note.

## 2024-01-17 NOTE — Telephone Encounter (Signed)
 Pt called to see if her procedure results were available. Please call [phone] (she is aware of her OV)

## 2024-01-30 ENCOUNTER — Ambulatory Visit: Payer: Medicaid Other | Admitting: Gastroenterology

## 2024-02-06 ENCOUNTER — Ambulatory Visit: Payer: Medicaid Other | Admitting: Gastroenterology

## 2024-02-12 NOTE — Progress Notes (Unsigned)
 GI Office Note    Referring Provider: Richardean Chimera, MD Primary Care Physician:  Richardean Chimera, MD  Primary Gastroenterologist:Charles K. Marletta Lor, DO   Chief Complaint   Chief Complaint  Patient presents with   Follow-up    States that she is still having issues with her stomach    History of Present Illness   [name] [name] is a 58 y.o. 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 Last seen in office 10/2023. She has history of chronic postprandial abdominal pain. Completed EGD/colonoscopy as outlined below.   Labs 2/25: White blood cell count 7500, hemoglobin 10.7, hematocrit 29.3, MCV 84.2, platelets 204,000, BNP 254, total bilirubin 0.8, alkaline phosphatase 73, AST 18, ALT less than 6, INR 1.01, lipase 51, cre 1.22  Chemical stresst test 02/01/2024: unremarkable  NM Myocardial Perfusion Study 01/30/24: 1. No reversible ischemia. Decreased counts in the inferior apical  wall represent prior infarction versus gut activity artifact. Favor  artifact  2. Normal left ventricular wall motion.  3. Left ventricular ejection fraction 79%  4. Non invasive risk stratification*: Low  5. Three foci radiotracer activity in the LEFT breast. Recommend  prompt diagnostic mammograms for POTENTIAL BREAST CARCINOMA..   EGD 12/2023: -small hiatal hernia -LA Grade B reflux esophagitis -esophageal mucosal changes c/w short-segment Barrett's esophaus -gastritis s/p bx nonspecific changes, no h.pylori -single duodenal polyp s/p bx unremarkable -repeat EGD in 8 weeks to evaluate for response to treatment  Colonoscopy 12/2023: -hemorrhoid found on perianal exam -non-bleeding internal hemorrhoids -one 5mm polyp in transverse colon removed, tubular adenoma -repeat colonoscopy in 7 years  Today: seen in the ED couple of times in February for abdominal pain. Pain in both lower abdomen but also in epigastric region. Her  breathing was rapid as well. Daughter took her to ED for evaluation. Cardiac work up as outlined. Labs showing mild normocytic anemia.    Continues to have abdominal pain. Worse with meals. Involves both lower abdominal pain and epigastric pain. Recently radiating into chest. No vomiting. Stopped eating fried foods, greasy foods but no significant improvement.  BMs ok. Uses lubiprostone if no BM in 2 days. No melena, brbpr.   Patient apparently not taking omeprazole. States she is taking famotidine given by ED. We had increased her omeprazole to BID after EGD 12/2023.   March 10th having a mammogram as something seen in left breast on recent cardiac imaging. Last mammogram was 2019.    Medications   Current Outpatient Medications  Medication Sig Dispense Refill   AJOVY 225 MG/1.5ML SOAJ Inject into the skin.            levothyroxine (SYNTHROID) 50 MCG tablet Take 50 mcg by mouth daily before breakfast.  1   lubiprostone (AMITIZA) 24 MCG capsule Take 1 capsule (24 mcg total) by mouth 2 (two) times daily with a meal. For constipation 60 capsule 5    Famotidine BID for 15 days   11   promethazine (PHENERGAN) 25 MG tablet Take 25 mg by mouth every 6 (six) hours as needed.     traZODone (DESYREL) 50 MG tablet Take 50 mg by mouth at bedtime.     No current facility-administered medications for this visit.    Allergies   Allergies as of 02/13/2024 - Review Complete 02/13/2024  Allergen Reaction Noted   Aspirin Other (See Comments) 01/07/2023   Bee pollen Hives 01/15/2024    Review of Systems  General: Negative for anorexia, weight loss, fever, chills, fatigue, weakness. ENT: Negative for hoarseness, difficulty swallowing, nasal congestion. CV: Negative for chest pain, angina, palpitations, dyspnea on exertion, peripheral edema. See hpi Respiratory: Negative for dyspnea at rest, dyspnea on exertion, cough, sputum, wheezing.  GI: See history of present illness. GU:  Negative for dysuria,  hematuria, urinary incontinence, urinary frequency, nocturnal urination.  Endo: Negative for unusual weight change.     Physical Exam   BP 104/62 (BP Location: Right Arm, Patient Position: Sitting, Cuff Size: Normal)   Pulse 62   Temp 98.1 F (36.7 C) (Oral)   Ht 5\' 6"  (1.676 m)   Wt 127 lb (57.6 kg)   SpO2 100%   BMI 20.50 kg/m    General: Well-nourished, well-developed in no acute distress.  Eyes: No icterus. Mouth: Oropharyngeal mucosa moist and pink   Abdomen: Bowel sounds are normal,  nondistended, no hepatosplenomegaly or masses,  no abdominal bruits or hernia , no rebound or guarding. Diffuse tenderness, in epigastric region and lower abdomen,appeared worse right abd Rectal: not performed  Extremities: No lower extremity edema. No clubbing or deformities. Neuro: Alert and oriented x 4   Skin: Warm and dry, no jaundice.   Psych: Alert and cooperative, normal mood and affect.  Labs   See hpi  Imaging Studies   No results found.  Assessment/Plan:   Chronic postprandial abdominal pain: -resume omeprazole 40mg  BID before meals -CTA A/P with and without contrast to rule out mesenteric ischemia  GERD/Barrett's: -no longer on PPI, was supposed to increase to twice daily after recent EGD with reflux esophagitis/Barrett's noted -restart omeprazole 40mg  BID before meals -use famotidine BID prn only for breakthrough heartburn -plan for repeat EGD in 8 weeks to reassess healing -she will also need Barrett's surveillance, likely every 5 years  Constipation: -continue lubiprostone up to BID, take dose if no BM for 24 hours  Normocytic anemia: -recent mildly low Hgb, new onset.  -CBC, iron/tibc/ferritin/b12/folate -no overt GI bleeding noted -EGD/colonoscopy as outlined above completed in 12/2023       Leanna Battles. Melvyn Neth, MHS, PA-C [facility]

## 2024-02-13 ENCOUNTER — Encounter: Payer: Self-pay | Admitting: Gastroenterology

## 2024-02-13 ENCOUNTER — Telehealth: Payer: Self-pay | Admitting: *Deleted

## 2024-02-13 ENCOUNTER — Ambulatory Visit: Payer: Medicaid Other | Admitting: Gastroenterology

## 2024-02-13 VITALS — BP 104/62 | HR 62 | Temp 98.1°F | Ht 66.0 in | Wt 127.0 lb

## 2024-02-13 DIAGNOSIS — R109 Unspecified abdominal pain: Secondary | ICD-10-CM | POA: Diagnosis not present

## 2024-02-13 DIAGNOSIS — D649 Anemia, unspecified: Secondary | ICD-10-CM

## 2024-02-13 DIAGNOSIS — K2100 Gastro-esophageal reflux disease with esophagitis, without bleeding: Secondary | ICD-10-CM

## 2024-02-13 DIAGNOSIS — K5900 Constipation, unspecified: Secondary | ICD-10-CM

## 2024-02-13 DIAGNOSIS — K2270 Barrett's esophagus without dysplasia: Secondary | ICD-10-CM

## 2024-02-13 DIAGNOSIS — R1084 Generalized abdominal pain: Secondary | ICD-10-CM

## 2024-02-13 MED ORDER — OMEPRAZOLE 40 MG PO CPDR: 40.0000 mg | DELAYED_RELEASE_CAPSULE | Freq: Two times a day (BID) | ORAL | 11 refills | Status: AC

## 2024-02-13 NOTE — Patient Instructions (Signed)
 Your recent upper endoscopy showed: -Small hiatal hernia -Inflammation in your esophagus related to acid reflux -Changes in the lining of your esophagus consistent with Barrett's esophagus which does put you at increased risk for esophageal cancer although esophageal cancer risk still remains very small.  You will need to have repeat endoscopy to follow every 3 to 5 years to make sure you are not developing any abnormal cells that need to be treated. -Dr. Marletta Lor wants to repeat your upper endoscopy after 8 weeks of omeprazole twice daily.  He wants to make sure that your esophagitis has healed.  Your recent colonoscopy showed: -Hemorrhoids, both external and internal -Single 5 mm polyp removed from your colon, precancerous.  You will need to have a follow-up colonoscopy in 7 years  Resume omeprazole 40 mg before breakfast and 40 mg before evening meal.  I have sent a prescription to [facility] drug. You may continue famotidine up to twice daily as needed for breakthrough heartburn type symptoms Continue lubiprostone 24 mcg once to twice daily for constipation.  If you have not had a bowel movement in 24 hours or so, please go ahead and take 1 dose. Recent labs done in the ED showed mild anemia.  We will update labs at [facility], please go at your convenience.  Orders provided today. We will schedule you for a special CT scan to look at blood flow going to your intestines.  We will be in touch with results as available. Please call with any questions or concerns.

## 2024-02-13 NOTE — Telephone Encounter (Signed)
 Pt called back and she is aware of appt details.

## 2024-02-13 NOTE — Telephone Encounter (Signed)
 LMOVM to give CTA A/P appt details. 4/22, arrival 3:45pm

## 2024-02-13 NOTE — Telephone Encounter (Signed)
 Order ID: [id number]       Authorized Approval Valid Through: 02/13/2024 - 04/12/2024

## 2024-02-19 ENCOUNTER — Other Ambulatory Visit: Payer: Self-pay | Admitting: Physician Assistant

## 2024-02-19 DIAGNOSIS — R921 Mammographic calcification found on diagnostic imaging of breast: Secondary | ICD-10-CM

## 2024-02-23 ENCOUNTER — Ambulatory Visit
Admission: RE | Admit: 2024-02-23 | Discharge: 2024-02-23 | Disposition: A | Discharge status: Home / Self Care | Source: Ambulatory Visit | Attending: Physician Assistant | Admitting: Physician Assistant

## 2024-02-23 DIAGNOSIS — R921 Mammographic calcification found on diagnostic imaging of breast: Secondary | ICD-10-CM

## 2024-02-23 HISTORY — PX: BREAST BIOPSY: SHX20

## 2024-02-26 LAB — SURGICAL PATHOLOGY

## 2024-04-02 ENCOUNTER — Ambulatory Visit (HOSPITAL_COMMUNITY)

## 2024-04-05 ENCOUNTER — Telehealth: Payer: Self-pay

## 2024-04-05 ENCOUNTER — Other Ambulatory Visit (HOSPITAL_COMMUNITY): Payer: Self-pay

## 2024-04-05 NOTE — Telephone Encounter (Signed)
 Pharmacy Patient Advocate Encounter   Received notification from CoverMyMeds that prior authorization for AJOVY  (fremanezumab -vfrm) injection 225MG /1.5ML auto-injectors is due for renewal.   Insurance verification completed.   The patient is insured through [facility].  Action: Patient hasn't been seen in your office in over a year. Plan requires updated chart notes for PA renewal.

## 2024-04-08 NOTE — Telephone Encounter (Signed)
 Noted, I see she's scheduled for 05/16/24 and she's on the wait list.

## 2024-04-16 ENCOUNTER — Other Ambulatory Visit: Payer: Self-pay | Admitting: Gastroenterology

## 2024-04-25 ENCOUNTER — Ambulatory Visit (HOSPITAL_COMMUNITY)
Admission: RE | Admit: 2024-04-25 | Discharge: 2024-04-25 | Disposition: A | Discharge status: Home / Self Care | Source: Ambulatory Visit | Attending: Gastroenterology | Admitting: Gastroenterology

## 2024-04-25 DIAGNOSIS — K2100 Gastro-esophageal reflux disease with esophagitis, without bleeding: Secondary | ICD-10-CM | POA: Insufficient documentation

## 2024-04-25 DIAGNOSIS — R1084 Generalized abdominal pain: Secondary | ICD-10-CM | POA: Diagnosis present

## 2024-04-25 DIAGNOSIS — D649 Anemia, unspecified: Secondary | ICD-10-CM | POA: Diagnosis present

## 2024-04-25 DIAGNOSIS — K5900 Constipation, unspecified: Secondary | ICD-10-CM | POA: Insufficient documentation

## 2024-04-25 MED ORDER — IOHEXOL 350 MG/ML SOLN
100.0000 mL | Freq: Once | INTRAVENOUS | Status: AC | PRN
  Administered 2024-04-25: 100 mL via INTRAVENOUS

## 2024-04-29 ENCOUNTER — Ambulatory Visit: Payer: Self-pay | Admitting: Gastroenterology

## 2024-04-29 ENCOUNTER — Encounter: Payer: Self-pay | Admitting: *Deleted

## 2024-05-01 LAB — IRON,TIBC AND FERRITIN PANEL
Ferritin: 356 ng/mL — ABNORMAL HIGH (ref 15–150)
Iron Saturation: 36 % (ref 15–55)
Iron: 96 ug/dL (ref 27–159)
Total Iron Binding Capacity: 267 ug/dL (ref 250–450)
UIBC: 171 ug/dL (ref 131–425)

## 2024-05-01 LAB — BASIC METABOLIC PANEL WITH GFR
BUN/Creatinine Ratio: 20 (ref 9–23)
BUN: 20 mg/dL (ref 6–24)
CO2: 21 mmol/L (ref 20–29)
Calcium: 9.6 mg/dL (ref 8.7–10.2)
Chloride: 101 mmol/L (ref 96–106)
Creatinine, Ser: 1 mg/dL (ref 0.57–1.00)
Glucose: 75 mg/dL (ref 70–99)
Potassium: 4.6 mmol/L (ref 3.5–5.2)
Sodium: 140 mmol/L (ref 134–144)
eGFR: 66 mL/min/{1.73_m2} (ref >59)

## 2024-05-01 LAB — CBC WITH DIFFERENTIAL/PLATELET
Basophils Absolute: 0.1 10*3/uL (ref 0.0–0.2)
Basos: 1 %
EOS (ABSOLUTE): 0.1 10*3/uL (ref 0.0–0.4)
Eos: 1 %
Hematocrit: 35 % (ref 34.0–46.6)
Hemoglobin: 11.7 g/dL (ref 11.1–15.9)
Immature Grans (Abs): 0 10*3/uL (ref 0.0–0.1)
Immature Granulocytes: 0 %
Lymphocytes Absolute: 3.3 10*3/uL — ABNORMAL HIGH (ref 0.7–3.1)
Lymphs: 59 %
MCH: 30.5 pg (ref 26.6–33.0)
MCHC: 33.4 g/dL (ref 31.5–35.7)
MCV: 91 fL (ref 79–97)
Monocytes Absolute: 0.3 10*3/uL (ref 0.1–0.9)
Monocytes: 6 %
Neutrophils Absolute: 1.8 10*3/uL (ref 1.4–7.0)
Neutrophils: 33 %
Platelets: 203 10*3/uL (ref 150–450)
RBC: 3.83 x10E6/uL (ref 3.77–5.28)
RDW: 13.8 % (ref 11.7–15.4)
WBC: 5.5 10*3/uL (ref 3.4–10.8)

## 2024-05-01 LAB — B12 AND FOLATE PANEL
Folate: 6.2 ng/mL (ref >3.0)
Vitamin B-12: 327 pg/mL (ref 232–1245)

## 2024-05-02 ENCOUNTER — Other Ambulatory Visit: Payer: Self-pay

## 2024-05-02 DIAGNOSIS — D649 Anemia, unspecified: Secondary | ICD-10-CM

## 2024-05-15 NOTE — Progress Notes (Unsigned)
 PATIENT: [name] [name] DOB: 04-09-1966  REASON FOR VISIT: follow up HISTORY FROM: patient PRIMARY NEUROLOGIST: Dr. Tresia Fruit   Chief Complaint  Patient presents with   Follow-up    Pt alone, rm 18, states that she is still having headaches. She continues the Ajovy  but its not effective in headache prevention. States she continues to have headaches daily. She takes tylenol  as needed which can help.      HISTORY OF PRESENT ILLNESS: Today 05/16/24   [name] [name] is a 58 y.o. female who has been followed in this office for migraine headaches and NF1. Returns today for follow-up. She has not taken ajovy  this month.  Reports that she does not find that it is as beneficial.  Reports that her headaches are almost daily.   Location: frontal region and around the head like a band. Neck will hurt  Frequency: daily  Duration: with Tyenol can last 20 minutes Aura: no Photophobia: yes Phonophobia: yes Nausea: no  Vomiting: no Numbness: no  Weakness:  no Visual changes: no Gait and balance: no Memory: no changes    Imaging: MRI brain w/wo contrast 03/28/23 reviewed in EPIC  Cardiac history: none   HISTORY URI [name] [name] is a 58 y.o. female here as requested by Skillman, Katherine E, * for migraines.  Medical history migraines, hypothyroidism, constipation, we also have a history of neurofibromatosis for patient and she saw Dr. Tilda Fogo my colleague in 2018 for this.  I reviewed [employer] notes, she has tried several medications for her headaches and not have help much, she has had side effects from Topamax , her headaches are more frequent and are lasting longer.  Located diffusely.  Aching pressure throbbing and worsening.  Symptom is ongoing. Notes from Dr. Tilda Fogo in the past state history of type I neurofibromatosis but patient has no recollection of being diagnosed with this. Per Dr. Tilda Fogo in 2016 "[name] is a 58 year old right-handed white female with a history of skin lesions  consistent with neurofibromatosis. The patient herself does not know anything about this, she denies any family history of other individuals with similar skin lesions. She is unaware of anyone in the family with the diagnosis of neurofibromatosis. She is not sure how long she has had the neurofibroma. 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 The neurofibroma are small, and do not appear to be resulting in any functional issues with her. The patient denies any family history of neurofibromatosis."   She has ad migraines for decades and has tried a lot of medications. Start in the forehead, then her whole face hurts and her mouth hurts, pounding/pulsating/moderate to severe, nausea, photophonia/phonophobia, tylenol  helps, going straight to bed in a dark room helps. Can last 24-48 hours. Having at least 15 migraine days month and daily headaches. No aura. No medication oveuse. Ongoing > 6 months at this severity and frequency. Had Headaches with flu A. Headaches are worsening. No vision loss. No other focal neurologic deficits, associated symptoms, inciting events or modifiable factors.   Reviewed notes, labs and imaging from outside physicians, which showed:   From a thorough review of records, medications tried for migraines include topiramate , Tylenol , Lexapro, Phenergan , tizanidine , amitriptyline and nortriptyline  are contraindicated since she is already on Lexapro which is a serotonin drug and the combination can cause serotonin syndrome, propranolol and other high blood pressure medications are contraindicated due to hypotension, also tried nortriptyline  in the past. Triptans contraindicated  due to increased risk of stroke in NF1. Aimovig contraindicated due to constipation. Patients with NF1 have an increased risk of stroke would not use triptans.   Mri brain 09/2016: FINDINGS: On sagittal images, the spinal cord is imaged caudally to C4  and is normal in caliber.   The contents of the posterior fossa are of normal size and position.   The pituitary gland and optic chiasm appear normal.    Brain volume appears normal.   The ventricles are normal in size and without distortion.  There are no abnormal extra-axial collections of fluid.     The cerebellum and brainstem appears normal.   The deep gray matter appears normal.  The cerebral hemispheres appear normal.  Diffusion weighted images are normal.  Susceptibility weighted images are normal.      The orbits appear normal.   The VIIth/VIIIth nerve complex appears normal.  The mastoid air cells appear normal.  The paranasal sinuses appear normal.  Flow voids are identified within the major intracerebral arteries.      After the infusion of contrast material, a normal enhancement pattern is noted.       IMPRESSION:  This is a normal MRI of the brain with and without contrast.  REVIEW OF SYSTEMS: Out of a complete 14 system review of symptoms, the patient complains only of the following symptoms, and all other reviewed systems are negative.   Listed in HPI  ALLERGIES: Allergies  Allergen Reactions   Aspirin Other (See Comments)    ABDOMINAL PAIN   Bee Pollen Hives    HOME MEDICATIONS: Outpatient Medications Prior to Visit  Medication Sig Dispense Refill   acetaminophen  (TYLENOL ) 500 MG tablet Take 500-1,000 mg by mouth every 6 (six) hours as needed for moderate pain (pain score 4-6) or headache.     AJOVY  225 MG/1.5ML SOAJ Inject into the skin.     levothyroxine (SYNTHROID) 50 MCG tablet Take 50 mcg by mouth daily before breakfast.  1   lubiprostone  (AMITIZA ) 24 MCG capsule TAKE ONE CAPSULE BY MOUTH TWICE DAILY WITH A MEAL FOR CONSTIPATION 60 capsule 5   omeprazole  (PRILOSEC) 40 MG capsule Take 1 capsule (40 mg total) by mouth 2 (two) times daily. 60 capsule 11   promethazine  (PHENERGAN ) 25 MG tablet Take 25 mg by mouth every 6 (six) hours as needed.     traZODone  (DESYREL) 50 MG tablet Take 50 mg by mouth at bedtime.     No facility-administered medications prior to visit.    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 Ardeth Krabbe colon prep excellent, normal colon.   COLONOSCOPY WITH PROPOFOL  N/A 01/02/2024   Procedure: COLONOSCOPY WITH PROPOFOL ;  Surgeon: Vinetta Greening, DO;  [facility];  Service: Endoscopy;  Laterality: N/A;  2:15 pm, asa 2   ESOPHAGOGASTRODUODENOSCOPY (EGD) WITH PROPOFOL  N/A 05/07/2019   Fields: Esophagus appeared normal, empiric dilatation due to dysphagia.  Gastritis/peptic duodenitis.  Biopsies negative for H. pylori and celiac disease.   ESOPHAGOGASTRODUODENOSCOPY (EGD) WITH PROPOFOL   01/02/2024   Procedure: ESOPHAGOGASTRODUODENOSCOPY (EGD) WITH PROPOFOL ;  Surgeon: Vinetta Greening, DO;  [facility];  Service: Endoscopy;;   POLYPECTOMY  01/02/2024   Procedure: POLYPECTOMY;  Surgeon: Vinetta Greening, DO;  [facility];  Service: Endoscopy;;   SAVORY DILATION N/A 05/07/2019   Procedure: SAVORY DILATION;  Surgeon: Alyce Jubilee, MD;   [facility]: [facility];  Service: Endoscopy;  Laterality: N/A;   TUBAL LIGATION      FAMILY HISTORY: Family History  Problem Relation Age of Onset   Breast cancer Mother    Hyperlipidemia Father    Hypertension Father    CAD Father    Breast cancer Sister    CAD Brother    Breast cancer Paternal Aunt    Neurofibromatosis Neg Hx    Colon cancer Neg Hx     SOCIAL HISTORY: Social History   Socioeconomic History   Marital status: Married    Spouse name: Not on file   Number of children: 2   Years of education: 12   Highest education level: Not on file  Occupational History   Occupation: unemployed  Tobacco Use   Smoking status: Former    Current packs/day: 0.00    Types: Cigarettes    Quit date: 12/12/1988    Years since quitting: 35.4   Smokeless tobacco: Never  Vaping Use   Vaping status: Never Used  Substance and Sexual Activity   Alcohol use: No   Drug use: No   Sexual activity: Not on file  Other Topics Concern   Not on file  Social History Narrative   Patient drinks 2 cups of decaffeine daily.   Patient is right handed.   Social Drivers of Corporate investment banker Strain: Not on file  Food Insecurity: Not on file  Transportation Needs: Not on file  Physical Activity: Not on file  Stress: Not on file  Social Connections: Not on file  Intimate Partner Violence: Not on file      PHYSICAL EXAM  Vitals:   05/16/24 1040  BP: 109/68  Pulse: 62  Weight: 126 lb (57.2 kg)  Height: 5\' 6"  (1.676 m)   Body mass index is 20.34 kg/m.  Generalized: Well developed, in no acute distress   Neurological examination  Mentation: Alert oriented to time, place, history taking. Follows all commands speech and language fluent Cranial nerve II-XII: Pupils were equal round reactive to light. Extraocular movements were full, visual field were full on confrontational test. Facial sensation and strength were normal. Uvula tongue midline. Head turning and shoulder  shrug  were normal and symmetric. Motor: The motor testing reveals 5 over 5 strength of all 4 extremities. Good symmetric motor tone is noted throughout.  Sensory: Sensory testing is intact to soft touch on all 4 extremities. No evidence of extinction is noted.  Coordination: Cerebellar testing reveals good finger-nose-finger and heel-to-shin bilaterally.  Gait and station: Gait is normal.  Reflexes: Deep  tendon reflexes are symmetric and normal bilaterally.   DIAGNOSTIC DATA (LABS, IMAGING, TESTING) - I reviewed patient records, labs, notes, testing and imaging myself where available.  Lab Results  Component Value Date   WBC 5.5 04/30/2024   HGB 11.7 04/30/2024   HCT 35.0 04/30/2024   MCV 91 04/30/2024   PLT 203 04/30/2024      Component Value Date/Time   NA 140 04/30/2024 1003   K 4.6 04/30/2024 1003   CL 101 04/30/2024 1003   CO2 21 04/30/2024 1003   GLUCOSE 75 04/30/2024 1003   BUN 20 04/30/2024 1003   CREATININE 1.00 04/30/2024 1003   CALCIUM 9.6 04/30/2024 1003   PROT 7.1 08/29/2016 1524   ALBUMIN 3.9 08/29/2016 1524   AST 20 08/29/2016 1524   ALT 17 08/29/2016 1524   ALKPHOS 92 08/29/2016 1524   BILITOT 0.7 08/29/2016 1524   GFRNONAA 67 08/29/2016 1524   GFRAA 77 08/29/2016 1524      ASSESSMENT AND PLAN 58 y.o. year old female  has a past medical history of Anxiety, Back pain, Breast mass, Common migraine with intractable migraine (08/24/2015), Depression, Dysphagia, GERD (gastroesophageal reflux disease), Headache, Hypothyroid, Neurofibromatosis (HCC), Neurofibromatosis, type 1 (HCC) (08/24/2015), and Vertigo. here with:   Migraine headaches without aura Neurofibromatosis type I  - Stop Ajovy  - Start Emgality monthly injection.  Reviewed medication with the patient and provided her information on her AVS. - Advised that if her headaches do not improve she should let us  know - Follow-up in 6 to 8 months or sooner if needed   Clem Currier, MSN, NP-C 05/16/2024,  10:46 AM [facility] [address] [place], [place] [address] [phone]

## 2024-05-16 ENCOUNTER — Ambulatory Visit: Payer: Medicaid Other | Admitting: Adult Health

## 2024-05-16 VITALS — BP 109/68 | HR 62 | Ht 66.0 in | Wt 126.0 lb

## 2024-05-16 DIAGNOSIS — G43709 Chronic migraine without aura, not intractable, without status migrainosus: Secondary | ICD-10-CM | POA: Diagnosis not present

## 2024-05-16 MED ORDER — EMGALITY 120 MG/ML ~~LOC~~ SOAJ: 120.0000 mg | SUBCUTANEOUS | 11 refills | Status: AC

## 2024-05-16 NOTE — Addendum Note (Signed)
 Addended by: Curry Dove on: 05/16/2024 09:03 PM   Modules accepted: Orders

## 2024-05-16 NOTE — Patient Instructions (Signed)
 Your Plan:  Stop Ajovy   Start Emgality monthly injection  If your symptoms worsen or you develop new symptoms please let us  know.   Thank you for coming to see us  at [facility]. I hope we have been able to provide you high quality care today.  You may receive a patient satisfaction survey over the next few weeks. We would appreciate your feedback and comments so that we may continue to improve ourselves and the health of our patients.

## 2024-05-17 ENCOUNTER — Other Ambulatory Visit (HOSPITAL_COMMUNITY): Payer: Self-pay

## 2024-05-17 ENCOUNTER — Telehealth: Payer: Self-pay

## 2024-05-17 NOTE — Telephone Encounter (Signed)
 Pharmacy Patient Advocate Encounter  Received notification from [employer] that Prior Authorization for Emgality 120MG /ML auto-injectors (migraine) has been APPROVED from 05/17/2024 to 08/15/2024   PA #/Case ID/Reference #: PA Case: [id number]  Key: [id number]

## 2024-05-24 ENCOUNTER — Other Ambulatory Visit (HOSPITAL_COMMUNITY)

## 2024-05-28 ENCOUNTER — Other Ambulatory Visit: Payer: Self-pay

## 2024-05-28 ENCOUNTER — Ambulatory Visit (HOSPITAL_COMMUNITY): Admitting: Anesthesiology

## 2024-05-28 ENCOUNTER — Encounter (HOSPITAL_COMMUNITY): Payer: Self-pay | Admitting: Internal Medicine

## 2024-05-28 ENCOUNTER — Encounter (HOSPITAL_COMMUNITY)
Admission: RE | Disposition: A | Discharge status: Home / Self Care | Payer: Self-pay | Source: Home / Self Care | Attending: Internal Medicine

## 2024-05-28 ENCOUNTER — Ambulatory Visit (HOSPITAL_COMMUNITY)
Admission: RE | Admit: 2024-05-28 | Discharge: 2024-05-28 | Disposition: A | Discharge status: Home / Self Care | Attending: Internal Medicine | Admitting: Internal Medicine

## 2024-05-28 DIAGNOSIS — I10 Essential (primary) hypertension: Secondary | ICD-10-CM | POA: Insufficient documentation

## 2024-05-28 DIAGNOSIS — Z79899 Other long term (current) drug therapy: Secondary | ICD-10-CM | POA: Insufficient documentation

## 2024-05-28 DIAGNOSIS — D649 Anemia, unspecified: Secondary | ICD-10-CM | POA: Diagnosis not present

## 2024-05-28 DIAGNOSIS — R519 Headache, unspecified: Secondary | ICD-10-CM | POA: Insufficient documentation

## 2024-05-28 DIAGNOSIS — K2100 Gastro-esophageal reflux disease with esophagitis, without bleeding: Secondary | ICD-10-CM | POA: Insufficient documentation

## 2024-05-28 DIAGNOSIS — K2970 Gastritis, unspecified, without bleeding: Secondary | ICD-10-CM | POA: Insufficient documentation

## 2024-05-28 DIAGNOSIS — F32A Depression, unspecified: Secondary | ICD-10-CM | POA: Diagnosis not present

## 2024-05-28 DIAGNOSIS — K2960 Other gastritis without bleeding: Secondary | ICD-10-CM | POA: Insufficient documentation

## 2024-05-28 DIAGNOSIS — E039 Hypothyroidism, unspecified: Secondary | ICD-10-CM | POA: Insufficient documentation

## 2024-05-28 DIAGNOSIS — K449 Diaphragmatic hernia without obstruction or gangrene: Secondary | ICD-10-CM | POA: Insufficient documentation

## 2024-05-28 DIAGNOSIS — R12 Heartburn: Secondary | ICD-10-CM | POA: Insufficient documentation

## 2024-05-28 DIAGNOSIS — R1310 Dysphagia, unspecified: Secondary | ICD-10-CM | POA: Insufficient documentation

## 2024-05-28 DIAGNOSIS — K2289 Other specified disease of esophagus: Secondary | ICD-10-CM

## 2024-05-28 DIAGNOSIS — F419 Anxiety disorder, unspecified: Secondary | ICD-10-CM | POA: Diagnosis not present

## 2024-05-28 DIAGNOSIS — Z79890 Hormone replacement therapy (postmenopausal): Secondary | ICD-10-CM | POA: Insufficient documentation

## 2024-05-28 DIAGNOSIS — Z87891 Personal history of nicotine dependence: Secondary | ICD-10-CM | POA: Diagnosis not present

## 2024-05-28 HISTORY — PX: ESOPHAGOGASTRODUODENOSCOPY: SHX5428

## 2024-05-28 SURGERY — EGD (ESOPHAGOGASTRODUODENOSCOPY)
Anesthesia: General

## 2024-05-28 MED ORDER — PROPOFOL 10 MG/ML IV BOLUS
INTRAVENOUS | Status: DC | PRN
  Administered 2024-05-28: 30 mg via INTRAVENOUS
  Administered 2024-05-28: 80 mg via INTRAVENOUS
  Administered 2024-05-28: 40 mg via INTRAVENOUS

## 2024-05-28 MED ORDER — LACTATED RINGERS IV SOLN: INTRAVENOUS | Status: DC | PRN

## 2024-05-28 MED ORDER — LACTATED RINGERS IV SOLN: INTRAVENOUS | Status: DC

## 2024-05-28 MED ORDER — LIDOCAINE 2% (20 MG/ML) 5 ML SYRINGE
INTRAMUSCULAR | Status: DC | PRN
  Administered 2024-05-28: 60 mg via INTRAVENOUS

## 2024-05-28 NOTE — Op Note (Signed)
 [facility] Patient Name: [name] [name] Procedure Date: 05/28/2024 11:33 AM MRN: [id number] Date of Birth: Sep 20, 1966 Attending MD: Rolando Cliche. Mordechai April , [place], [id number] CSN: [id number] Age: 58 Admit Type: Outpatient Procedure:                Upper GI endoscopy Indications:              Dysphagia, Heartburn Providers:                Rolando Cliche. Mordechai April, DO, Troy Furnish. Hazeline Lister RN, RN,                            Ashley Goins, Kristine L. Roberta Chin, Technician Referring MD:              Medicines:                See the Anesthesia note for documentation of the                            administered medications Complications:            No immediate complications. Estimated Blood Loss:     Estimated blood loss was minimal. Procedure:                Pre-Anesthesia Assessment:                           - The anesthesia plan was to use monitored                            anesthesia care (MAC).                           After obtaining informed consent, the endoscope was                            passed under direct vision. Throughout the                            procedure, the patient's blood pressure, pulse, and                            oxygen saturations were monitored continuously. The                            GIF-H190 ([id number]) scope was introduced through the                            mouth, and advanced to the second part of duodenum.                            The upper GI endoscopy was accomplished without                            difficulty. The patient tolerated the procedure  well. Scope In: 11:48:04 AM Scope Out: 11:54:00 AM Total Procedure Duration: 0 hours 5 minutes 56 seconds  Findings:      The Z-line was irregular. Previously noted esophagitis has healed.       Biopsies were taken with a cold forceps for histology.      A small hiatal hernia was present.      No endoscopic abnormality was evident in the esophagus to explain the        patient's complaint of dysphagia. Preparations were made for empiric       dilation. A TTS dilator was passed through the scope. Dilation with an       18-19-20 mm balloon dilator was performed to 20 mm. Dilation was       performed with a mild resistance at 20 mm. Estimated blood loss was none.      Patchy mild inflammation characterized by erythema was found in the       gastric body and in the gastric antrum. Biopsies were taken with a cold       forceps for Helicobacter pylori testing.      The duodenal bulb, first portion of the duodenum and second portion of       the duodenum were normal. Impression:               - Z-line irregular. Biopsied.                           - Small hiatal hernia.                           - Gastritis. Biopsied.                           - Normal duodenal bulb, first portion of the                            duodenum and second portion of the duodenum. Moderate Sedation:      Per Anesthesia Care Recommendation:           - Patient has a contact number available for                            emergencies. The signs and symptoms of potential                            delayed complications were discussed with the                            patient. Return to normal activities tomorrow.                            Written discharge instructions were provided to the                            patient.                           - Resume previous diet.                           -  Continue present medications.                           - Await pathology results.                           - Repeat upper endoscopy PRN for retreatment.                           - Return to GI clinic in 8 weeks.                           - Use a proton pump inhibitor PO BID. Procedure Code(s):        --- Professional ---                           [phone], Esophagogastroduodenoscopy, flexible,                            transoral; with biopsy, single or multiple Diagnosis Code(s):         --- Professional ---                           K22.89, Other specified disease of esophagus                           K44.9, Diaphragmatic hernia without obstruction or                            gangrene                           K29.70, Gastritis, unspecified, without bleeding                           R13.10, Dysphagia, unspecified                           R12, Heartburn CPT copyright 2022 American Medical Association. All rights reserved. The codes documented in this report are preliminary and upon coder review may  be revised to meet current compliance requirements. Rolando Cliche. Mordechai April, DO Rolando Cliche. Mordechai April, DO 05/28/2024 12:01:22 PM This report has been signed electronically. Number of Addenda: 0

## 2024-05-28 NOTE — H&P (Signed)
 Primary Care Physician:  Leesa Pulling, MD Primary Gastroenterologist:  Dr. Mordechai April  Pre-Procedure History & Physical: HPI:  [name] [name] is a 58 y.o. 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 Ardeth Krabbe colon prep excellent, normal colon.   COLONOSCOPY WITH PROPOFOL  N/A 01/02/2024   Procedure: COLONOSCOPY WITH PROPOFOL ;  Surgeon: Vinetta Greening, DO;  [facility];  Service: Endoscopy;  Laterality: N/A;  2:15 pm, asa 2   ESOPHAGOGASTRODUODENOSCOPY (EGD) WITH PROPOFOL  N/A 05/07/2019   Fields: Esophagus appeared normal, empiric dilatation due to dysphagia.  Gastritis/peptic duodenitis.  Biopsies negative for H. pylori and celiac disease.   ESOPHAGOGASTRODUODENOSCOPY (EGD) WITH PROPOFOL   01/02/2024   Procedure: ESOPHAGOGASTRODUODENOSCOPY (EGD) WITH PROPOFOL ;  Surgeon: Vinetta Greening, DO;  [facility];  Service: Endoscopy;;   POLYPECTOMY  01/02/2024   Procedure: POLYPECTOMY;  Surgeon: Vinetta Greening, DO;  [facility];  Service: Endoscopy;;   SAVORY DILATION N/A 05/07/2019    Procedure: SAVORY DILATION;  Surgeon: Alyce Jubilee, MD;  [facility];  Service: Endoscopy;  Laterality: N/A;   TUBAL LIGATION      Prior to Admission medications   Medication Sig Start Date End Date Taking? Authorizing Provider  acetaminophen  (TYLENOL ) 500 MG tablet Take 500-1,000 mg by mouth every 6 (six) hours as needed for moderate pain (pain score 4-6) or headache.   Yes [provider]  levothyroxine (SYNTHROID) 50 MCG tablet Take 50 mcg by mouth daily before breakfast. 03/15/16  Yes [provider]  lubiprostone  (AMITIZA ) 24 MCG capsule TAKE ONE CAPSULE BY MOUTH TWICE DAILY WITH A MEAL FOR CONSTIPATION 04/16/24  Yes Lanney Pitts, PA-C  omeprazole  (PRILOSEC) 40 MG capsule Take 1 capsule (40 mg total) by mouth 2 (two) times daily. 02/13/24 02/12/25 Yes Lanney Pitts, PA-C  promethazine  (PHENERGAN ) 25 MG tablet Take 25 mg by mouth every 6 (six) hours as needed. 01/28/24  Yes [provider]  traZODone (DESYREL) 50 MG tablet Take 50 mg by mouth at bedtime. 01/31/24  Yes [provider]  Galcanezumab -gnlm (EMGALITY ) 120 MG/ML SOAJ Inject 120 mg into the skin every 30 (thirty) days. 05/16/24   Clem Currier, NP    Allergies as of 04/29/2024 - Review Complete 02/13/2024  Allergen Reaction Noted   Aspirin Other (See Comments) 01/07/2023   Bee pollen Hives 01/15/2024    Family History  Problem Relation Age of Onset  Breast cancer Mother    Hyperlipidemia Father    Hypertension Father    CAD Father    Breast cancer Sister    CAD Brother    Breast cancer Paternal Aunt    Neurofibromatosis Neg Hx    Colon cancer Neg Hx     Social History   Socioeconomic History   Marital status: Married    Spouse name: Not on file   Number of children: 2   Years of education: 12   Highest education level: Not on file  Occupational History   Occupation: unemployed  Tobacco Use   Smoking status: Former    Current packs/day: 0.00    Types:  Cigarettes    Quit date: 12/12/1988    Years since quitting: 35.4   Smokeless tobacco: Never  Vaping Use   Vaping status: Never Used  Substance and Sexual Activity   Alcohol use: No   Drug use: No   Sexual activity: Not on file  Other Topics Concern   Not on file  Social History Narrative   Patient drinks 2 cups of decaffeine daily.   Patient is right handed.   Social Drivers of Corporate investment banker Strain: Not on file  Food Insecurity: Not on file  Transportation Needs: Not on file  Physical Activity: Not on file  Stress: Not on file  Social Connections: Not on file  Intimate Partner Violence: Not on file    Review of Systems: General: Negative for fever, chills, fatigue, weakness. Eyes: Negative for vision changes.  ENT: Negative for hoarseness, difficulty swallowing , nasal congestion. CV: Negative for chest pain, angina, palpitations, dyspnea on exertion, peripheral edema.  Respiratory: Negative for dyspnea at rest, dyspnea on exertion, cough, sputum, wheezing.  GI: See history of present illness. GU:  Negative for dysuria, hematuria, urinary incontinence, urinary frequency, nocturnal urination.  MS: Negative for joint pain, low back pain.  Derm: Negative for rash or itching.  Neuro: Negative for weakness, abnormal sensation, seizure, frequent headaches, memory loss, confusion.  Psych: Negative for anxiety, depression Endo: Negative for unusual weight change.  Heme: Negative for bruising or bleeding. Allergy: Negative for rash or hives.  Physical Exam: Vital signs in last 24 hours: Temp:  [97.9 F (36.6 C)] 97.9 F (36.6 C) (06/17 1020) Pulse Rate:  [77] 77 (06/17 1020) Resp:  [19] 19 (06/17 1020) BP: (112)/(61) 112/61 (06/17 1020) SpO2:  [100 %] 100 % (06/17 1020) Weight:  [54.4 kg] 54.4 kg (06/17 1020)   General:   Alert,  Well-developed, well-nourished, pleasant and cooperative in NAD Head:  Normocephalic and atraumatic. Eyes:  Sclera clear, no  icterus.   Conjunctiva pink. Ears:  Normal auditory acuity. Nose:  No deformity, discharge,  or lesions. Msk:  Symmetrical without gross deformities. Normal posture. Extremities:  Without clubbing or edema. Neurologic:  Alert and  oriented x4;  grossly normal neurologically. Skin:  Intact without significant lesions or rashes. Psych:  Alert and cooperative. Normal mood and affect.   Impression/Plan: [name] [name] is here for an EGD to be performed for GERD with esophagitis  Risks, benefits, limitations, imponderables and alternatives regarding procedure have been reviewed with the patient. Questions have been answered. All parties agreeable.

## 2024-05-28 NOTE — Anesthesia Preprocedure Evaluation (Signed)
 Anesthesia Evaluation  Patient identified by MRN, date of birth, ID band Patient awake    Reviewed: Allergy & Precautions, H&P , NPO status , Patient's Chart, lab work & pertinent test results, reviewed documented beta blocker date and time   Airway Mallampati: II  TM Distance: >3 FB Neck ROM: full    Dental no notable dental hx.    Pulmonary neg pulmonary ROS, former smoker   Pulmonary exam normal breath sounds clear to auscultation       Cardiovascular Exercise Tolerance: Good hypertension, negative cardio ROS  Rhythm:regular Rate:Normal     Neuro/Psych  Headaches PSYCHIATRIC DISORDERS Anxiety Depression       GI/Hepatic Neg liver ROS,GERD  ,,  Endo/Other  Hypothyroidism    Renal/GU negative Renal ROS  negative genitourinary   Musculoskeletal   Abdominal   Peds  Hematology  (+) Blood dyscrasia, anemia   Anesthesia Other Findings   Reproductive/Obstetrics negative OB ROS                             Anesthesia Physical Anesthesia Plan  ASA: 2  Anesthesia Plan: General   Post-op Pain Management:    Induction:   PONV Risk Score and Plan: Propofol  infusion  Airway Management Planned:   Additional Equipment:   Intra-op Plan:   Post-operative Plan:   Informed Consent: I have reviewed the patients History and Physical, chart, labs and discussed the procedure including the risks, benefits and alternatives for the proposed anesthesia with the patient or authorized representative who has indicated his/her understanding and acceptance.     Dental Advisory Given  Plan Discussed with: CRNA  Anesthesia Plan Comments:        Anesthesia Quick Evaluation

## 2024-05-28 NOTE — Transfer of Care (Signed)
 Immediate Anesthesia Transfer of Care Note  Patient: [name] [name]  Procedure(s) Performed: EGD (ESOPHAGOGASTRODUODENOSCOPY)  Patient Location: Endoscopy Unit  Anesthesia Type:General  Level of Consciousness: awake  Airway & Oxygen Therapy: Patient Spontanous Breathing  Post-op Assessment: Report given to RN and Post -op Vital signs reviewed and stable  Post vital signs: Reviewed and stable  Last Vitals:  Vitals Value Taken Time  BP 89/53 05/28/24 11:59  Temp 36.5 C 05/28/24 11:59  Pulse 77 05/28/24 11:59  Resp 18 05/28/24 11:59  SpO2 98 % 05/28/24 11:59    Last Pain:  Vitals:   05/28/24 1159  TempSrc: Oral  PainSc:       Patients Stated Pain Goal: 8 (05/28/24 1020)  Complications: No notable events documented.

## 2024-05-28 NOTE — Discharge Instructions (Addendum)
 EGD Discharge instructions Please read the instructions outlined below and refer to this sheet in the next few weeks. These discharge instructions provide you with general information on caring for yourself after you leave the hospital. Your doctor may also give you specific instructions. While your treatment has been planned according to the most current medical practices available, unavoidable complications occasionally occur. If you have any problems or questions after discharge, please call your doctor. ACTIVITY You may resume your regular activity but move at a slower pace for the next 24 hours.  Take frequent rest periods for the next 24 hours.  Walking will help expel (get rid of) the air and reduce the bloated feeling in your abdomen.  No driving for 24 hours (because of the anesthesia (medicine) used during the test).  You may shower.  Do not sign any important legal documents or operate any machinery for 24 hours (because of the anesthesia used during the test).  NUTRITION Drink plenty of fluids.  You may resume your normal diet.  Begin with a light meal and progress to your normal diet.  Avoid alcoholic beverages for 24 hours or as instructed by your caregiver.  MEDICATIONS You may resume your normal medications unless your caregiver tells you otherwise.  WHAT YOU CAN EXPECT TODAY You may experience abdominal discomfort such as a feeling of fullness or "gas" pains.  FOLLOW-UP Your doctor will discuss the results of your test with you.  SEEK IMMEDIATE MEDICAL ATTENTION IF ANY OF THE FOLLOWING OCCUR: Excessive nausea (feeling sick to your stomach) and/or vomiting.  Severe abdominal pain and distention (swelling).  Trouble swallowing.  Temperature over 101 F (37.8 C).  Rectal bleeding or vomiting of blood.   Your upper endoscopy again showed inflammation in your stomach.  I took samples today.  Previously noted esophagitis has healed.  I did resample your esophagus to screen  for Barrett's esophagus.  Mild tightening of your esophagus which I stretched out today.  Hopefully this helps with feeling of food getting stuck.  Small bowel is normal.  Await pathology results, my office will contact you.  Continue on omeprazole  twice daily.  Follow-up in GI office in 6 to 8 weeks. Message sent to office and will get this scheduled.    I hope you have a great rest of your week!  Rolando Cliche. Mordechai April, D.O. Gastroenterology and Hepatology [facility]

## 2024-05-29 ENCOUNTER — Encounter (HOSPITAL_COMMUNITY): Payer: Self-pay | Admitting: Internal Medicine

## 2024-05-29 LAB — SURGICAL PATHOLOGY

## 2024-06-01 NOTE — Anesthesia Postprocedure Evaluation (Signed)
 Anesthesia Post Note  Patient: [name] [name]  Procedure(s) Performed: EGD (ESOPHAGOGASTRODUODENOSCOPY) Balloon dilation wire-guided  Patient location during evaluation: Phase II Anesthesia Type: General Level of consciousness: awake Pain management: pain level controlled Vital Signs Assessment: post-procedure vital signs reviewed and stable Respiratory status: spontaneous breathing and respiratory function stable Cardiovascular status: blood pressure returned to baseline and stable Postop Assessment: no headache and no apparent nausea or vomiting Anesthetic complications: no Comments: Late entry   No notable events documented.   Last Vitals:  Vitals:   05/28/24 1159 05/28/24 1202  BP: (!) 89/53 103/66  Pulse: 77 68  Resp: 18 19  Temp: 36.5 C   SpO2: 98% 99%    Last Pain:  Vitals:   05/28/24 1202  TempSrc:   PainSc: 0-No pain                 [name]

## 2024-06-03 ENCOUNTER — Ambulatory Visit: Payer: Self-pay | Admitting: Internal Medicine

## 2024-07-18 ENCOUNTER — Other Ambulatory Visit (HOSPITAL_COMMUNITY): Payer: Self-pay

## 2024-07-23 ENCOUNTER — Telehealth: Payer: Self-pay | Admitting: *Deleted

## 2024-07-23 ENCOUNTER — Encounter: Payer: Self-pay | Admitting: Gastroenterology

## 2024-07-23 ENCOUNTER — Ambulatory Visit: Admitting: Gastroenterology

## 2024-07-23 VITALS — BP 116/72 | HR 78 | Temp 98.1°F | Ht 66.0 in | Wt 119.8 lb

## 2024-07-23 DIAGNOSIS — K5909 Other constipation: Secondary | ICD-10-CM

## 2024-07-23 DIAGNOSIS — R634 Abnormal weight loss: Secondary | ICD-10-CM

## 2024-07-23 DIAGNOSIS — R1319 Other dysphagia: Secondary | ICD-10-CM

## 2024-07-23 DIAGNOSIS — R1030 Lower abdominal pain, unspecified: Secondary | ICD-10-CM | POA: Diagnosis not present

## 2024-07-23 DIAGNOSIS — K2100 Gastro-esophageal reflux disease with esophagitis, without bleeding: Secondary | ICD-10-CM

## 2024-07-23 DIAGNOSIS — K5900 Constipation, unspecified: Secondary | ICD-10-CM

## 2024-07-23 NOTE — Patient Instructions (Signed)
 Make sure you are taking omeprazole  40mg  twice daily before breakfast and evening meal.  Continue famotidine 20mg  twice daily as needed for heartburn/indigestion.  Continue lubiprostone  24mcg up to twice daily for constipation.  Talk with PCP regarding chronic urinary symptoms, you may benefit from seeing a urologist.  Barium swallow test planned.

## 2024-07-23 NOTE — Telephone Encounter (Signed)
Pt called back and is aware of appt details 

## 2024-07-23 NOTE — Progress Notes (Signed)
 GI Office Note    Referring Provider: Toribio Jerel MATSU, MD Primary Care Physician:  Toribio Jerel MATSU, MD  Primary Gastroenterologist: Carlin POUR. Cindie, DO   Chief Complaint   Chief Complaint  Patient presents with   Follow-up    Follow up. Having problems with throat. Food is getting stuck, sometime she gets choked     History of Present Illness   [name] [name] is a 58 y.o. female presenting today for follow up. EGD completed since last ov. She has history of neurofibromatosis type I, at increased risk of GISTs.  H/o reflux esophagitis, constipation.  Discussed the use of AI scribe software for clinical note transcription with the patient, who gave verbal consent to proceed.   She has been experiencing difficulty swallowing and esophageal pain. She recently underwent second upper endoscopy this year with esophageal dilation. Initially, there was improvement in swallowing, but the relief was short-lived. Currently, she experiences pain when swallowing, a sensation of food sticking, and occasional choking, requiring her to drink fluids to help food pass. She sometimes coughs as if food goes down the wrong way, but this is not her main concern. She is taking omeprazole  40 mg twice a day and famotidine 20 mg twice a day for acid reflux. She takes famotidine 20mg  BID. She has experienced a weight loss of about eight pounds since June, attributed to stress and poor appetite following the loss of her mother. She monitors her weight regularly at home.  For constipation she is taking lubiprostone  24mcg usually only once daily, controls her BMs. She denies melena, brbpr. She continues to have constant lower abdominal pain, unrelated to meal. She reports frequent urinary issues, including burning during urination and recurrent bladder infections every two to three months. She has not seen a urologist for these issues. She completed CTA A/P in May for postprandial abdominal pain with patent  mesenteric arteries. Colonoscopy completed earlier this year.     Wt Readings from Last 7 Encounters:  07/23/24 119 lb 12.8 oz (54.3 kg)  05/28/24 120 lb (54.4 kg)  05/16/24 126 lb (57.2 kg)  02/13/24 127 lb (57.6 kg)  01/02/24 120 lb (54.4 kg)  10/24/23 127 lb 6.4 oz (57.8 kg)  01/24/23 126 lb 6.4 oz (57.3 kg)    Prior Data    CTA A/P with contrast 04/2024: IMPRESSION:  No acute findings. Patent mesenteric arteries.    EGD 05/2024: -esophagus dilated for history of dysphagia -z-line irregular, s/p bx, no Barrett's, changes c/w reflux -small hh -gastritis s/p bx, no h.pylori -normal examined duodenum  EGD 12/2023: -small hiatal hernia -LA Grade B reflux esophagitis -esophageal mucosal changes c/w short-segment Barrett's esophaus -gastritis s/p bx nonspecific changes, no h.pylori -single duodenal polyp s/p bx unremarkable -repeat EGD in 8 weeks to evaluate for response to treatment   Colonoscopy 12/2023: -hemorrhoid found on perianal exam -non-bleeding internal hemorrhoids -one 5mm polyp in transverse colon removed, tubular adenoma -repeat colonoscopy in 7 years  Medications   Current Outpatient Medications  Medication Sig Dispense Refill   acetaminophen  (TYLENOL ) 500 MG tablet Take 500-1,000 mg by mouth every 6 (six) hours as needed for moderate pain (pain score 4-6) or headache.     buPROPion (WELLBUTRIN XL) 150 MG 24 hr tablet Take 150 mg by mouth daily.     famotidine (PEPCID) 20 MG tablet Take 20 mg by mouth 2 (two) times daily.     Galcanezumab -gnlm (EMGALITY ) 120 MG/ML SOAJ Inject 120 mg into the skin  every 30 (thirty) days. 1.12 mL 11   levothyroxine (SYNTHROID) 50 MCG tablet Take 50 mcg by mouth daily before breakfast.  1   lubiprostone  (AMITIZA ) 24 MCG capsule TAKE ONE CAPSULE BY MOUTH TWICE DAILY WITH A MEAL FOR CONSTIPATION (Patient taking differently: Take 24 mcg by mouth daily with breakfast.) 60 capsule 5   omeprazole  (PRILOSEC) 40 MG capsule Take 1  capsule (40 mg total) by mouth 2 (two) times daily. 60 capsule 11   promethazine  (PHENERGAN ) 25 MG tablet Take 25 mg by mouth every 6 (six) hours as needed.     traZODone (DESYREL) 50 MG tablet Take 50 mg by mouth at bedtime.     No current facility-administered medications for this visit.    Allergies   Allergies as of 07/23/2024 - Review Complete 07/23/2024  Allergen Reaction Noted   Aspirin Other (See Comments) 01/07/2023   Bee pollen Hives 01/15/2024        Review of Systems   General: Negative for  fever, chills, fatigue, weakness. See hpi ENT: Negative for hoarseness, difficulty swallowing , nasal congestion.see hpi CV: Negative for chest pain, angina, palpitations, dyspnea on exertion, peripheral edema.  Respiratory: Negative for dyspnea at rest, dyspnea on exertion, cough, sputum, wheezing.  GI: See history of present illness. GU:  Negative for dysuria, hematuria, urinary incontinence, urinary frequency, nocturnal urination. Recurrent UTI, urinary urgency. Endo: Negative for unusual weight change.     Physical Exam   BP 116/72 (BP Location: Right Arm, Patient Position: Sitting, Cuff Size: Normal)   Pulse 78   Temp 98.1 F (36.7 C) (Temporal)   Ht 5' 6 (1.676 m)   Wt 119 lb 12.8 oz (54.3 kg)   BMI 19.34 kg/m    General: Well-nourished, well-developed in no acute distress.  Eyes: No icterus. Mouth: Oropharyngeal mucosa moist and pink   Lungs: Clear to auscultation bilaterally.  Heart: Regular rate and rhythm, no murmurs rubs or gallops.  Abdomen: Bowel sounds are normal, nondistended, no hepatosplenomegaly or masses,  no abdominal bruits or hernia , no rebound or guarding. Mild diffuse lower abdominal tenderness Rectal: not performed Extremities: No lower extremity edema. No clubbing or deformities. Neuro: Alert and oriented x 4   Skin: Warm and dry, no jaundice.   Psych: Alert and cooperative, normal mood and affect.  Labs   Lab Results  Component Value  Date   IRON 96 04/30/2024   TIBC 267 04/30/2024   FERRITIN 356 (H) 04/30/2024   Lab Results  Component Value Date   WBC 5.5 04/30/2024   HGB 11.7 04/30/2024   HCT 35.0 04/30/2024   MCV 91 04/30/2024   PLT 203 04/30/2024   Lab Results  Component Value Date   NA 140 04/30/2024   CL 101 04/30/2024   K 4.6 04/30/2024   CO2 21 04/30/2024   BUN 20 04/30/2024   CREATININE 1.00 04/30/2024   EGFR 66 04/30/2024   CALCIUM 9.6 04/30/2024   ALBUMIN 3.9 08/29/2016   GLUCOSE 75 04/30/2024   Lab Results  Component Value Date   VITAMINB12 327 04/30/2024   Lab Results  Component Value Date   FOLATE 6.2 04/30/2024    Imaging Studies   No results found.  Assessment/Plan:     Dysphagia with history of esophageal dilation and esophagitis Persistent dysphagia with transient improvement post-esophageal dilation. She has completed two EGDs this year, the first one with grade LA B esophagitis/gastritis, confirmed Barrett's. Second EGD, esophagitis healed, esophageal dilated. Now with odynophagia and dysphagia for  solids, with occasional choking. DDX includes esophageal motility disorder, oropharyngeal component. - Order barium swallow study to evaluate dysphagia, rule out stricture, gross way of evaluating for motility disorder.  - Consider esophageal manometry if barium study is inconclusive  Chronic constipation Chronic constipation managed with lubiprostone  once daily, providing adequate control.  .  Chronic abdominal pain Chronic lower abdominal pain, constant, not food or drink related. Extensive evaluation as outlined. May be related to recurrent UTIs.  -consider urology evaluation for recurrent UTIs, patient will discuss with PCP.  -Continue to monitor for change or worsening symptoms.   Abnormal weight loss Weight loss of approximately 8 pounds since June, potentially stress-related due to bereavement. Current weight at 119 pounds. - Advise regular weight monitoring at home,  ideally weekly or bi-weekly, under consistent conditions. Let us  know if ongoing loss.     [name] [name], MHS, PA-C [facility]

## 2024-07-23 NOTE — Telephone Encounter (Signed)
 LMOVM to give BPE appt details 9/3, arrival 8:15am, npo midnight

## 2024-08-05 ENCOUNTER — Other Ambulatory Visit (HOSPITAL_COMMUNITY): Payer: Self-pay

## 2024-08-05 ENCOUNTER — Telehealth: Payer: Self-pay

## 2024-08-05 NOTE — Telephone Encounter (Signed)
    It is time to renew PA-pt has not been evaluated since starting medication. Insurance requires documentation on how med is working for the PT-please the above questions needing documentation.

## 2024-08-06 NOTE — Telephone Encounter (Signed)
 Lvm 1st attempt by hf 08/06/24

## 2024-08-07 ENCOUNTER — Other Ambulatory Visit (HOSPITAL_COMMUNITY): Payer: Self-pay

## 2024-08-07 NOTE — Telephone Encounter (Signed)
 Pt returned call and stated that they have reduced in frequency and intensity as far as migraines/ha's go. Pt mentioned overall improvement on therapy is good. Pt denied unacceptable toxicity. Routing to pa team to make them aware

## 2024-08-07 NOTE — Telephone Encounter (Signed)
 Pharmacy Patient Advocate Encounter  Received notification from [employer] that Prior Authorization for Emgality  has been APPROVED from 08/07/2024 to 08/07/2025   PA #/Case ID/Reference #: [id number]

## 2024-08-07 NOTE — Telephone Encounter (Signed)
 Pharmacy Patient Advocate Encounter   Received notification from Fax that prior authorization for Emgality  is required/requested.   Insurance verification completed.   The patient is insured through [employer] .   Per test claim: PA required; PA submitted to above mentioned insurance via Latent Key/confirmation #/EOC [facility] Status is pending

## 2024-08-07 NOTE — Telephone Encounter (Signed)
 Lvm 2nd attempt by hf 08/07/24

## 2024-08-08 ENCOUNTER — Encounter: Payer: Self-pay | Admitting: Gastroenterology

## 2024-08-14 ENCOUNTER — Other Ambulatory Visit (HOSPITAL_COMMUNITY)

## 2024-08-22 ENCOUNTER — Other Ambulatory Visit: Payer: Self-pay

## 2024-08-22 DIAGNOSIS — D649 Anemia, unspecified: Secondary | ICD-10-CM

## 2024-08-28 ENCOUNTER — Other Ambulatory Visit (HOSPITAL_COMMUNITY)

## 2024-11-22 ENCOUNTER — Other Ambulatory Visit: Payer: Self-pay | Admitting: Gastroenterology

## 2024-12-12 ENCOUNTER — Encounter: Payer: Self-pay | Admitting: Gastroenterology

## 2025-01-23 ENCOUNTER — Ambulatory Visit: Admitting: Adult Health
